# Patient Record
Sex: Female | Born: 1935 | Race: White | Hispanic: No | Smoking: Never smoker
Health system: Southern US, Community
[De-identification: ages and names within clinical notes are randomized; demographics above are authoritative.]

## PROBLEM LIST (undated history)

## (undated) DIAGNOSIS — E785 Hyperlipidemia, unspecified: Secondary | ICD-10-CM

## (undated) DIAGNOSIS — M8580 Other specified disorders of bone density and structure, unspecified site: Secondary | ICD-10-CM

## (undated) DIAGNOSIS — M1990 Unspecified osteoarthritis, unspecified site: Secondary | ICD-10-CM

## (undated) DIAGNOSIS — I10 Essential (primary) hypertension: Secondary | ICD-10-CM

## (undated) DIAGNOSIS — E559 Vitamin D deficiency, unspecified: Secondary | ICD-10-CM

## (undated) HISTORY — PX: SHOULDER SURGERY: SHX246

---

## 1999-07-06 ENCOUNTER — Encounter: Admission: RE | Admit: 1999-07-06 | Discharge: 1999-10-04 | Payer: Self-pay | Admitting: *Deleted

## 2001-09-20 ENCOUNTER — Encounter: Admission: RE | Admit: 2001-09-20 | Discharge: 2001-09-20 | Payer: Self-pay | Admitting: *Deleted

## 2001-09-20 ENCOUNTER — Encounter: Payer: Self-pay | Admitting: *Deleted

## 2002-11-24 ENCOUNTER — Encounter: Payer: Self-pay | Admitting: *Deleted

## 2002-11-24 ENCOUNTER — Ambulatory Visit (HOSPITAL_COMMUNITY): Admission: RE | Admit: 2002-11-24 | Discharge: 2002-11-24 | Payer: Self-pay

## 2003-11-24 ENCOUNTER — Ambulatory Visit (HOSPITAL_COMMUNITY): Admission: RE | Admit: 2003-11-24 | Discharge: 2003-11-24 | Payer: Self-pay | Admitting: Gastroenterology

## 2006-01-05 ENCOUNTER — Encounter: Admission: RE | Admit: 2006-01-05 | Discharge: 2006-01-05 | Payer: Self-pay | Admitting: *Deleted

## 2008-08-24 ENCOUNTER — Inpatient Hospital Stay (HOSPITAL_COMMUNITY): Admission: EM | Admit: 2008-08-24 | Discharge: 2008-09-02 | Payer: Self-pay | Admitting: Emergency Medicine

## 2008-09-04 ENCOUNTER — Ambulatory Visit: Payer: Self-pay | Admitting: Family Medicine

## 2008-10-01 ENCOUNTER — Emergency Department (HOSPITAL_BASED_OUTPATIENT_CLINIC_OR_DEPARTMENT_OTHER): Admission: EM | Admit: 2008-10-01 | Discharge: 2008-10-01 | Payer: Self-pay | Admitting: Emergency Medicine

## 2008-10-06 ENCOUNTER — Observation Stay (HOSPITAL_COMMUNITY): Admission: EM | Admit: 2008-10-06 | Discharge: 2008-10-08 | Payer: Self-pay | Admitting: Emergency Medicine

## 2009-02-22 ENCOUNTER — Inpatient Hospital Stay (HOSPITAL_COMMUNITY): Admission: AD | Admit: 2009-02-22 | Discharge: 2009-02-24 | Payer: Self-pay | Admitting: Orthopedic Surgery

## 2009-02-25 ENCOUNTER — Encounter: Payer: Self-pay | Admitting: Family Medicine

## 2009-02-25 ENCOUNTER — Ambulatory Visit: Payer: Self-pay | Admitting: Family Medicine

## 2009-02-25 DIAGNOSIS — M84439A Pathological fracture, unspecified ulna and radius, initial encounter for fracture: Secondary | ICD-10-CM | POA: Insufficient documentation

## 2009-02-25 DIAGNOSIS — E785 Hyperlipidemia, unspecified: Secondary | ICD-10-CM | POA: Insufficient documentation

## 2009-02-25 DIAGNOSIS — I10 Essential (primary) hypertension: Secondary | ICD-10-CM | POA: Insufficient documentation

## 2009-02-25 DIAGNOSIS — L299 Pruritus, unspecified: Secondary | ICD-10-CM | POA: Insufficient documentation

## 2009-02-25 DIAGNOSIS — M810 Age-related osteoporosis without current pathological fracture: Secondary | ICD-10-CM | POA: Insufficient documentation

## 2009-02-25 DIAGNOSIS — M7500 Adhesive capsulitis of unspecified shoulder: Secondary | ICD-10-CM | POA: Insufficient documentation

## 2009-02-25 DIAGNOSIS — R269 Unspecified abnormalities of gait and mobility: Secondary | ICD-10-CM | POA: Insufficient documentation

## 2009-02-25 DIAGNOSIS — S82899A Other fracture of unspecified lower leg, initial encounter for closed fracture: Secondary | ICD-10-CM | POA: Insufficient documentation

## 2009-06-09 ENCOUNTER — Emergency Department (HOSPITAL_COMMUNITY): Admission: EM | Admit: 2009-06-09 | Discharge: 2009-06-09 | Payer: Self-pay | Admitting: Emergency Medicine

## 2010-10-20 ENCOUNTER — Encounter
Admission: RE | Admit: 2010-10-20 | Discharge: 2010-10-20 | Payer: Self-pay | Source: Home / Self Care | Attending: Family Medicine | Admitting: Family Medicine

## 2011-01-31 LAB — CBC
HCT: 33.8 % — ABNORMAL LOW (ref 36.0–46.0)
MCHC: 34.6 g/dL (ref 30.0–36.0)
MCV: 92.4 fL (ref 78.0–100.0)
RBC: 3.65 MIL/uL — ABNORMAL LOW (ref 3.87–5.11)
RDW: 13.6 % (ref 11.5–15.5)

## 2011-01-31 LAB — COMPREHENSIVE METABOLIC PANEL
ALT: 14 U/L (ref 0–35)
Albumin: 3.7 g/dL (ref 3.5–5.2)
Alkaline Phosphatase: 59 U/L (ref 39–117)
BUN: 12 mg/dL (ref 6–23)
Calcium: 9.2 mg/dL (ref 8.4–10.5)
Creatinine, Ser: 0.98 mg/dL (ref 0.4–1.2)
GFR calc Af Amer: >60 mL/min (ref >60)
Potassium: 3.7 mEq/L (ref 3.5–5.1)
Sodium: 134 mEq/L — ABNORMAL LOW (ref 135–145)
Total Bilirubin: 0.9 mg/dL (ref 0.3–1.2)

## 2011-01-31 LAB — PROTIME-INR: INR: 1.1 (ref 0.00–1.49)

## 2011-01-31 LAB — APTT: aPTT: 34 seconds (ref 24–37)

## 2011-03-07 NOTE — H&P (Signed)
NAME:  [name], [name]               ACCOUNT NO.:  [id number]   MEDICAL RECORD NO.:  [id number]          PATIENT TYPE:  OBV   LOCATION:  0104                         FACILITY:  [facility]   PHYSICIAN:  Hollice Espy, M.D.DATE OF BIRTH:  1922-04-18   DATE OF ADMISSION:  10/06/2008  DATE OF DISCHARGE:                              HISTORY & PHYSICAL   PRIMARY CARE PHYSICIAN:  Dr. Camillo Flaming.   CHIEF COMPLAINT:  Falls.   HISTORY OF PRESENT ILLNESS:  The patient is an 77 year old white female  with past medical history of hypertension, who had normal renal function  1 month prior when she sustained a fall injury leading to a fracture of  her right humerus.  At that time, she had a surgical repair and was  discharged home.  She reports in the past few weeks she said problems  with increasing weakness, lightheadedness, dizziness and more falls.  She was seen in the emergency room on October 01, 2008.  She was sent  over from the office where it was noted that her BUN and creatinine were  up with a BUN of 30 and a creatinine of 1.8.  The patient received IV  fluids and was discharged back home.  Since that time, she had continued  problems with lightheadedness and then today she returned after  sustaining another fall, landing on her right hand, the same arm that  she had her surgery on.  X-rays were done which showed no evidence of  any misalignment or repeat fracture.  Labs were checked on the patient.  She was found to have a slightly low potassium of 126, potassium of 2.7,  a normal white count with no shift, normal urinalysis and a BUN 15 and  creatinine of 1.47, again with a previous normal creatinine a month ago  of 0.8.  CT scan head was done which was unremarkable.  With these  findings, it was felt best that patient come in for further evaluation  and treatment.  Currently, she is doing okay.  She complains of some  mild dizziness and weakness, as well as some right arm pain.  She  denies  any headaches, vision changes, dysphagia, chest pain, palpitations,  shortness of breath, wheezing or coughing.  No abdominal pain, no  hematuria or dysuria.  No constipation or diarrhea.  She complains of  some right upper extremity pain where she had fallen and landed on her  arm, but review of systems is otherwise negative.   PAST MEDICAL HISTORY:  1. Hypertension.  2. Recent right humeral fracture, status post repair.  3. Hyperlipidemia.   MEDICATIONS:  She is on atenolol, Lipitor, Dynacin and Micardis.   ALLERGIES:  NO KNOWN DRUG ALLERGIES.   SOCIAL HISTORY:  No tobacco, alcohol or drug use.   FAMILY HISTORY:  Noncontributory.   PHYSICAL EXAMINATION:  VITAL SIGNS:  On admission, temperature 97.9,  heart rate 63, blood pressure 168/74, respirations 20.  O2 sat 99% on  room air.  GENERAL:  She is alert and oriented x3 in no apparent distress.  HEENT:  Normocephalic, atraumatic.  Mucous  membranes are slightly dry.  NECK:  She has no carotid bruits.  HEART:  Regular rate and rhythm.  S1-S2.  LUNGS:  Clear to auscultation bilaterally.  ABDOMEN:  Soft, nontender, nondistended.  Positive bowel sounds.  EXTREMITIES:  Show no clubbing, cyanosis or edema.  I deferred  musculoskeletal exam of her right upper extremity secondary to pain,  otherwise unremarkable.   X-RAYS:  CT scan of the head shows mild age-related atrophy, chronic  small vessel disease, no acute process.  Right humerus x-ray shows  evidence of healing of fracture of mid humerus at tibial prosthesis.  There is at least loosening and possibly fracture of the proximal  humerus seen previously.  This was discussed with the patient's  orthopedist, Dr. Melvyn Novas and is not felt to be anything acute or  reversible.   LABORATORY DATA:  White count 10.4, hemoglobin and hematocrit 13 and 39,  MCV 94, platelet count 182, no shift.  Sodium 126, potassium 3.7,  chloride 86, bicarb 28, BUN 15, creatinine 1.47, glucose  106.  Cardiac  markers unremarkable.  UA completely normal.   ASSESSMENT/PLAN:  1. Acute renal failure.  2. Hyponatremia.  3. Hypokalemia.  4. Falls with dizziness.   This all equates mostly to the patient being dehydrated, probably  potentiated by continuing to take her antihypertensive medications.  Will hydrate.  Have PT/OT to see.  Keep for 24-hour observation and plan  to discharge tomorrow.      Hollice Espy, M.D.  Electronically Signed     SKK/MEDQ  D:  10/06/2008  T:  10/06/2008  Job:  [id number]   cc:   Juluis Rainier, M.D.  Fax: [phone]

## 2011-03-07 NOTE — H&P (Signed)
[name], [name]               ACCOUNT NO.:  [id number]   MEDICAL RECORD NO.:  [id number]          PATIENT TYPE:  INP   LOCATION:  1614                         FACILITY:  [facility]   PHYSICIAN:  Marlowe Kays, M.D.  DATE OF BIRTH:  [birth year]   DATE OF ADMISSION:  02/22/2009  DATE OF DISCHARGE:                              HISTORY & PHYSICAL   CHIEF COMPLAINT:  Pain in my left ankle.   PRESENT ILLNESS:  This 77 year old white female was at her [place]  party near [place], [place] when she was descending some stairs  and slipped twisting her left ankle.  She had immediate pain to the  ankle and was unable to stand it was taken to [facility]  where x-rays were performed and closed reduction was performed by the  emergency room physician after consultation with an orthopedist. And she  was put in a posterior splint/cast.  She was offered to be treated  there, but her home as in [place].  She chose to come back to  [facility] for treatment.  She saw Dr. Melvyn Novas today in our office due  to the fact that he had previously treated a left wrist fracture.  Her  ankle continues to be displaced at the fracture site.  We were on call  for the practice, and Dr. Melvyn Novas referred the patient to [place].  We plan  to return the patient to the surgical suite at which time we will remove  all the cast material and attempt closed reduction for better alignment  and recast her.  Surgery is not indicated at this time due to the fact  that she has a considerable amount of fracture blisters and this would  of course allow the possibility of entry of infection into the left  ankle joint.   When seen in the patient's room, her son, Onalee Hua, was present.  Dr.  Simonne Come outlined our plans.   PAST MEDICAL HISTORY:  This lady has been in relatively good health  throughout her lifetime.  She has developed hyperlipidemia,  hypertension.   CURRENT MEDICATIONS:  Lipitor 80 mg 1/2 pill daily,  atenolol 100 mg  daily, hydrocodone p.r.n., naproxen 500 mg q.12 h. p.r.n., Caltrate  daily, and Benadryl 1 or 2 as needed.   ALLERGIES:  She has no known medical allergies.   REVIEW OF SYSTEMS:  CNS: No seizures or paralysis, numbness, double  vision.  RESPIRATORY:  No productive cough, no hemoptysis or shortness  of breath.  Cardiovascular: No chest pain, no angina, no orthopnea.  GASTROINTESTINAL: No nausea, vomiting, melena, or bloody stool.  GENITOURINARY:  No discharge, dysuria, or hematuria.  MUSCULOSKELETAL:  Primarily in present illness.   SOCIAL HISTORY:  The patient neither smokes nor drink.   PHYSICAL EXAMINATION:  GENERAL APPEARANCE:  Alert and cooperative 7-  year-old white female seen lying in the hospital bed.  Her son is  present.  VITAL SIGNS:  Blood pressure 184/75, pulse 75 regular, respirations 16,  temperature 97.7.  HEENT: Normocephalic. PERRLA.  No trauma.  NECK:  Supple. No lymphadenopathy.  CHEST:  Clear to auscultation. No  rhonchi or rales.  HEART: Regular rate and rhythm.  No murmurs are heard.  ABDOMEN: Soft, nontender.  Bowel sounds present.  GENITALIA, RECTAL, PELVIC, BREASTS:  Not done. Not pertinent to present  illness.  EXTREMITIES:  Left lower extremity is in a short-leg cast/splint.  She  has less sensation to the toes, but she can move the toes well.  She has  good capillary refill of the toe tips.   ADMISSION DIAGNOSES:  1. Closed displaced fracture of the left ankle (tibia and fibula).  2. Hypertension.  3. Hyperlipidemia.   PLAN:  We will be taking the patient for closed reduction and casting of  the left ankle using C-arm for visualization.  At a later date,  depending on how well the fracture can be reduced, may go for open  reduction and internal fixation of the ankle fracture.  Again all the  above was explained to the patient and her son by Dr. Simonne Come.      Dooley L. Cherlynn June.    ______________________________   Marlowe Kays, M.D.    DLU/MEDQ  D:  02/22/2009  T:  02/22/2009  Job:  [id number]   cc:   Marlowe Kays, M.D.  Fax: [phone]

## 2011-03-07 NOTE — Discharge Summary (Signed)
NAME:  [name], [name] NO.:  [id number]   MEDICAL RECORD NO.:  [id number]          PATIENT TYPE:  OBV   LOCATION:  1431                         FACILITY:  [facility]   PHYSICIAN:  Ramiro Harvest, MD    DATE OF BIRTH:  [birth year]   DATE OF ADMISSION:  10/06/2008  DATE OF DISCHARGE:                               DISCHARGE SUMMARY   The patient's primary care physician is Dr. Juluis Rainier of [facility].   DISCHARGE DIAGNOSES:  1. Acute renal failure.  2. Hyponatremia.  3. Hypokalemia.  4. Falls.  5. Dehydration.  6. Hypertension.  7. Recent right humeral fracture status post repair.  8. Hyperlipidemia.  9. Osteoporosis.  10.Right periprostatic humerus fracture.   DISCHARGE MEDICATIONS:  1. Lipitor 40 mg p.o. daily.  2. Aspirin 81 mg p.o. daily.  3. Vitamin D plus Caltrate p.o. daily.   The patient has been instructed to not take her Micardis and to not take  her atenolol until she follows up with her PCP at which point in time it  will be decided whether to restart the patient's antihypertensive  medications.   DISPOSITION AND FOLLOW-UP:  The patient will be discharged home with  home health, PT, OT.  The patient is to follow up with PCP in 1 week. On  follow-up the patient will need a basic metabolic profile to follow-up  the patient's renal function and electrolytes, and the patient's blood  pressure will need to be reassessed to determine whether the patient's  antihypertensives need to be restarted.   CONSULTATIONS DONE:  None.   PROCEDURES DONE:  1. Plain x-rays of the right humerus were done on October 06, 2008      that showed evidence of healing of the fracture of the mid humerus      at the tip of the prosthesis. There is at least loosening and      possible fracture of the proximal humerus as seen previously as      well.  2. CT of the head obtained October 06, 2008 showed mild age-related      atrophy, chronic small-vessel  disease.  No sign of acute or      reversible process.   BRIEF ADMISSION HISTORY AND PHYSICAL:  [name] is an 77-year-  old pleasant female with a past medical history of hypertension who had  normal renal function 1 month prior to admission when she sustained a  fall injury leading to a fracture of her right humerus.  At that time  she had a surgical repair, was discharged home. She reports that in the  past few months that she has had problems with increased weakness,  lightheadedness, dizziness and more falls.  She was seen in the ED on  October 01, 2008. She was sent over from the office where it was noted  that her BUN and creatinine were up with a BUN of 30 and a creatinine of  1.8.  The patient received IV fluids, was discharged back home.  Since  that time she continued with problems with lightheadedness, and then  on  the day of admission returned after sustaining another fall, landing on  her right hand, the same arm where she had her surgery. X-rays were done  which showed no evidence of any misalignment or repeat fracture.  Labs  were checked on the patient.  The patient was found to have a sodium of  126 and a potassium of 2.7, normal white count, no shift. Normal  urinalysis, BUN of 15, creatinine of 1.47. Again with a previous normal  creatinine a month ago of 0.8.  CT scan of the head was done which was  unremarkable. With these findings it was felt the patient could come in  for further evaluation and treatment.  Currently the patient is doing  fine.  The patient complained of some mild dizziness and weakness as  well as some right arm pain.  She denied any headaches, no visual  changes, dysphagia, no chest pain or palpitations.  No shortness of  breath, no wheezing or coughing.  No abdominal pain, no hematuria or  dysuria, no constipation or diarrhea.  The patient complained of some  right upper extremity pain where she had fallen and landed on her arm.  But  review of systems was otherwise negative.   PHYSICAL EXAM:  VITAL SIGNS:  Per admitting physician temperature 97.9,  pulse of 63, blood pressure 168/74, respiratory rate 20, satting 99% on  room air.  GENERAL: The patient is alert and oriented x3.  No apparent distress.  HEENT: Normocephalic, atraumatic.  Mucous membranes are slightly dry.  NECK:  With no carotid bruits.  CARDIOVASCULAR: Regular rate and rhythm, S1 and S2.  RESPIRATORY:  Lungs are clear to auscultation bilaterally.  ABDOMEN: Soft, nontender, nondistended, positive bowel sounds.  EXTREMITIES: No clubbing, cyanosis or edema.  MUSCULOSKELETAL:  Exam was deferred due to pain in the right upper  extremity. Otherwise unremarkable.   X-RAYS:  CT of the head showed mild age-related atrophy, chronic small-  vessel disease.  No acute process. Right humerus x-ray showed evidence  of fever, no fracture of the mid humerus. At the tibial prosthesis there  is at least loosening and possibly fracture of the proximal humerus seen  previously.  This was discussed with the patient's orthopedist, Dr.  Melvyn Novas, and it was not felt to be anything acute or reversible.   CBC white count 10.4, hemoglobin 13, hematocrit 39, MCV 94, platelets  182, no shift.  Sodium 126, potassium 3.7, chloride 86, bicarb 28, BUN  15, creatinine 1.47, glucose of 106.  Cardiac markers were unremarkable.  UA was completely normal.   HOSPITAL COURSE:  1. Acute renal failure.  The patient was admitted with acute renal      failure.  It was felt the patient's acute renal failure was      secondary to prerenal azotemia, secondary to dehydration in the      setting of antihypertensive use.  Urine sodium and urine creatinine      were obtained.  The patient was placed on IV fluids and hydrated.      The patient's renal function was monitored and by day of discharge      the patient's renal function was back to her baseline and the      patient's acute renal failure  had resolved.  The patient's      creatinine on discharge was 0.85, and the patient will be      discharged in stable and improved condition.  2. Hyponatremia.  The patient  on admission was noted to hyponatremic.      It was felt this was secondary to hypovolemic hyponatremia in the      setting of her diuretic use. A TSH was checked which came back at      5.69, and a free T4 was obtained which was within normal limits. A      free T3 was also obtained which was within normal limits.  Head CT      was obtained with results as stated above.  The patient was      hydrated with IV fluids and the patient's sodium levels were      monitored.  The patient's hyponatremia improved on a daily basis      and by day of discharge the patient's sodium was at 132. The      patient will need follow-up BMET with PCP in 1 week for follow-up      on the patient's hyponatremia.  3. Hypokalemia.  The patient was noted to be hypokalemic. During the      admission the patient's potassium was repleted, and by day of      discharge the patient's hypokalemia had resolved.  4. Dehydration.  The patient was hydrated with IV fluids and monitored      throughout the hospitalization. By day of discharge the patient was      euvolemic. The rest of the patient's chronic medical issues were      stable throughout the hospitalization, and the patient was      discharged in stable and improved condition with follow-up as      stated above.   On day of discharge vital signs temperature 98.2, blood pressure 130/63,  pulse of 67, respiratory rate 18, satting 96% on room air.   DISCHARGE LABS:  Sodium 132, potassium 4.2, chloride 102, bicarb 24, BUN  10, creatinine 0.85, glucose of 86 and a calcium of 8.9, magnesium level  of 1.8.  It was a pleasure taking care of [name].      Ramiro Harvest, MD  Electronically Signed     DT/MEDQ  D:  10/08/2008  T:  10/08/2008  Job:  [id number]   cc:   Dossie Der, MD  Fax: [phone]

## 2011-03-07 NOTE — Op Note (Signed)
NAME:  [name], [name] NO.:  [id number]   MEDICAL RECORD NO.:  [id number]          PATIENT TYPE:  INP   LOCATION:  5024                         FACILITY:  [facility]   PHYSICIAN:  Madelynn Done, MD  DATE OF BIRTH:  [birth year]   DATE OF PROCEDURE:  08/26/2008  DATE OF DISCHARGE:                               OPERATIVE REPORT   PREOPERATIVE DIAGNOSES:  1. Right wrist intra-articular distal radius fracture 4 more fragments      associated ulnar styloid fracture.  2. Osteoporosis.   POSTOPERATIVE DIAGNOSES:  1. Right wrist intra-articular distal radius fracture 4 more fragments      associated ulnar styloid fracture.  2. Osteoporosis.   ATTENDING SURGEON:  Madelynn Done, MD, who scrubbed and present for  the entire procedure.   ASSISTANT SURGEON:  None.   SURGICAL PROCEDURES:  1. Open treatment of right intra-articular distal radius fracture 4      more fragments with internal fixation.  2. Extensor pollicis longus tendo sheath incision, then released.  3. Radiograph reviews, right forearm and wrist.   ANESTHESIA:  General via endotracheal tube.   SURGICAL IMPLANT:  Synthes dorsal bridge plate with 3 screws distally  and four screws proximally, 2 bicortical screws and the rest locking  screws.   SURGICAL INDICATIONS:  [name] is an 77 year old female who fell at  home and sustained a complex and significantly comminuted distal radius  fracture.  The patient had marked displacement of her radius Grimes after  reduction.  The above procedure was recommended in order to restore the  length to the radius as well as provide a mechanism for appropriate  healing.  Risks, benefits, and alternatives were discussed in detail  with the patient and the patient's son.  Risks include but not limited  to bleeding, infection, nerve damage, nonunion, malunion, hardware  failure, damage to nearby nerves, arteries, and tendon and need for  further surgical  intervention.  All questions were addressed prior to  surgery.   Bone graft substitute 5 mL of Ortho-Glass mixed with cancellous bone  chip.   DESCRIPTION OF PROCEDURE:  The patient was properly identified in the  preoperative holding area and a marker made on the right wrist to  indicate correct operative site.  The patient then brought back to the  operating room, placed supine on the anesthesia table, general  anesthesia was administered via endotracheal tube.  The patient received  preoperative antibiotics prior to any skin incisions.  A well-padded  tourniquet was then placed on the right brachium and sealed with a 1000  drape.  The right upper extremity was prepped and draped in normal  sterile fashion.  A time-out was called.  The correct site was  identified, and the procedure was then begun.  Closed manipulation was  then performed in its position confirmed using a mini C-arm.  It was  felt to be an adequate reduction via closed methods, however, after the  reduction, has a high degree of ostia, poor bone quality as there was  further collapse and it was felt necessary  to proceed with operative  intervention.  Using the finger trap traction and 10 pounds weight,  finger traps were applied and the reduction was then performed and  maintained of the length.  The limb was then elevated using Esmarch  exsanguination.  The tourniquet was insufflated.  Using 3 incisions one  directly over the long finger metacarpal, the other one directly over  Lister tubercle, and one more proximally.  Dissection was carried down  through the skin and subcutaneous tissue.  The EPL was then identified  directly over the Lister tubercle and tendon sheath was incised and the  EPL was then transferred radially.  Going through the portion between  the third and fourth dorsal compartment, the large metaphysial void in  the bone was then identified.  A 5 mL of Ortho-Glass bone graft  substitute was then  packed into the defect.  There was still defect  present and then using a several mL of the cancellous chip, this was  packed dorsally from a dorsal volar direction to get some support and  sealed the large metaphysial void.  After the open bone grafting, the  open reduction was then performed of the 4 more intra-articular  fragments.  This was done and then the plate was then placed from the  distal to proximal direction.  After placement of the plate, the  alignment was then confirmed and using the mini C-arm.  It was then  fixed distally with a 2.4 mm of bicortical screw and it was then fixed  proximally with another 2.7 mm bicortical screw.  Its position was then  confirmed, then the reduction was maintained.  Following these remaining  screws, 2 more distally and 3 more proximally bicortical locking screws  were then placed with good fixation.  There was screw holes present  directly over the fracture site but secondary to the high degree of  comminution and osteal growth, screws were then placed in the central  portion of the plate.  After the open reduction was then complete, final  radiographs were then obtained in both planes, the wrist and forearm.  The wounds were then thoroughly irrigated with saline solution.  The  deep tissues directly over the Lister tubercle were then closed with a 2-  0 Vicryl and closed over the plate.  There was a barrier between the  endplate.  The wound was then irrigated and then subcutaneous stitch  with 4-0 Vicryl suture.  The tourniquet was then deflated with good  perfusion of the digits.  Tourniquet was then deflated.  Using 3-0 and 4-  0 nylon sutures, the skin was then closed in horizontal, mattress, and  running sutures.  Adaptic, Xeroform dressing, sterile compressive  dressing was then applied.  The patient was then extubated and taken to  recovery room in good condition.  The patient final images were obtained  to reduce the wrist,  forearm.   Postoperative intraoperative radiograph reviews; the wrist do show good  position of the implant.  She does have a comminuted fragment.  There  was still displacement of the fracture site with improvement in radius  alignment and the radius is backed out of the way.   POSTOPERATIVE PLAN:  The patient would be admitted for IV antibiotics  for pain control, to be seen at the 10-day mark and 4-week mark,  radiographs at each time, plate likewise, for a total of 10 weeks.      Madelynn Done, MD  Electronically Signed  FWO/MEDQ  D:  08/26/2008  T:  08/27/2008  Job:  [id number]

## 2011-03-07 NOTE — Op Note (Signed)
[name], [name]               ACCOUNT NO.:  [id number]   MEDICAL RECORD NO.:  [id number]          PATIENT TYPE:  INP   LOCATION:  1614                         FACILITY:  [facility]   PHYSICIAN:  Marlowe Kays, M.D.  DATE OF BIRTH:  1921/11/16   DATE OF PROCEDURE:  02/22/2009  DATE OF DISCHARGE:                               OPERATIVE REPORT   PREOPERATIVE DIAGNOSIS:  Closed bimalleolar fracture dislocation, left  ankle.   POSTOPERATIVE DIAGNOSIS:  Closed bimalleolar fracture dislocation, left  ankle.   OPERATION:  Closed reduction of the bimalleolar fracture dislocation  left ankle with lysis of fracture blister and application of a PTB  fiberglass cast.   SURGEON:  Marlowe Kays, M.D.   ASSISTANTTerie Purser L. Cherlynn June.   ANESTHESIA:  General.   DESCRIPTION OF PROCEDURE:  She has had a prior distal fibular fracture  and had a plate on it.  She injured this ankle down in [place] 2 days  ago, was seen locally there at [facility] and was given the  choice of having the ankle fixed there or to come home and she elected  to come on home.  She was seen in our acute care clinic this afternoon  and was found to have a posterior dislocation of the talus relative to  the ankle and displaced fractures of the posterior malleolus and the  medial malleolus Accordingly, she is admitted at this time for closed  reduction of the ankle dislocation with thought that any operative  intervention would be postponed until her fracture blisters had healed.   PROCEDURE:  Under satisfactory general anesthesia, the short-leg splint  cast was removed and using the C-arm, we took baseline AP and lateral x-  rays of her ankle.  With Mr. Idolina Primer using counter traction on the  proximal tibia and me at the ankle, with posterior to anterior tugging  and traction, I was able to reduce the dislocation but we found it to be  quite unstable.  With manipulation I was able to obtain close  to an  anatomic reduction of the 2 fractures as well as reduction of the  dislocation.  I then placed Betadine over the fracture blister, lysed it  with a sterile needle and then we applied Adaptic and Betadine solution  over the Adaptic, followed by sterile 4x4s and while I held the ankle  reduced, Mr. Idolina Primer applied a PTB cast with traction, dorsal stress  on the ankle and slight inversion to try and reduce the medial malleolar  fracture.  Once the cast had been applied, we took AP and lateral x-rays  and it showed anatomic reduction of not only the ankle dislocation but  the 2 fractures.  Accordingly we then awakened her and she was taken to  the recovery in satisfactory tissue with no known complication.           ______________________________  Marlowe Kays, M.D.     JA/MEDQ  D:  02/22/2009  T:  02/23/2009  Job:  [id number]

## 2011-03-07 NOTE — Discharge Summary (Signed)
[name], [name]               ACCOUNT NO.:  [id number]   MEDICAL RECORD NO.:  [id number]          PATIENT TYPE:  INP   LOCATION:  1614                         FACILITY:  [facility]   PHYSICIAN:  Marlowe Kays, M.D.  DATE OF BIRTH:  [birth year]   DATE OF ADMISSION:  02/22/2009  DATE OF DISCHARGE:                               DISCHARGE SUMMARY   DATE OF ANTICIPATED DISCHARGE:  Feb 24, 2009.   ADMITTING DIAGNOSES:  1. Closed displaced fracture of the left ankle (tibia and fibula and      talus).  2. Hypertension.  3. Hyperlipidemia.   DISCHARGE DIAGNOSES:  1. Closed displaced fracture of the left ankle (tibia and fibula and      talus).  2. Hypertension.  3. Hyperlipidemia.  4. Periprosthetic fracture of the right humerus and fracture right      wrist in the distant past.   OPERATION:  On Feb 22, 2009 the patient underwent closed bimalleolar  fracture dislocation left ankle.  D. Limmie Patricia.A. assisted.   BRIEF HISTORY:  This 77 year old lady who was at her birthday [place] in  [name].  She was descending some stairs and twisted her left  ankle/foot.  She had immediate pain in the ankle, foot area with  swelling.  She was taken to [facility].  The x-ray showed a  bimalleolar fracture dislocation.  Closed reduction was performed, and  she was placed in a posterior splint cast.  She chose to return to her  home in [name] for further treatment.  Dr. Melvyn Novas saw her today due  to the fact that he had previously treated her for a right  periprosthetic humeral fracture and wrist fracture.  He attempted closed  reduction in the office.  However, due to the spasms and swelling in the  leg as well as fracture blisters, this was not accomplished.  We decided  to admit her for a closed reduction under anesthesia and cast after  application.   COURSE IN THE [place]:  The patient tolerated surgical procedure quite  well.  The reduction was very satisfactory.  She  tolerated the cast.  Was able to get about the bedside chair.  Nonweightbearing was allowed;  nonweightbearing on the left lower extremity and minimal on the right  upper extremity as tolerated.  We thought that maybe a platform walker  might be indicated.  We will try this prior to her discharge to  [name] where we believe the bed has been chosen for her  rehabilitation.   Neurovascular is intact to the toes, good capillary refills on the left.  The morning of anticipated discharge she was bright, cheerful.  Had  minimal to no discomfort.   We placed her on Lovenox postoperatively for the prevention of DVT, and  we will discontinue this and give her aspirin daily when she is  discharged.   LABORATORY VALUES IN THE [place]:  Showed a hematocrit of 33.8,  hemoglobin is 11.7.  Blood chemistry was essentially normal.  Sodium was  134.  Vital signs were stable, and the INR on Feb 22, 2009 was  1.1.  No  chest x-ray seen on this chart.  The electrocardiogram showed normal  sinus rhythm.   CONDITION ON DISCHARGE:  Improved, stable.   PLAN:  The patient is discharged to skilled nursing for continuing  rehabilitation.  Orthopedically the cast to be kept clean and dry.  We  recommend no weightbearing on the left lower extremities so as not to  displace the closed reduction.  She may rest the cast on the ground, but  no weightbearing.  If she needs a platform walker for ambulation for the  right upper extremity,  recommend she continue with that for her  ambulation.  We would like to follow her back in our office in 1 week at  which time we will x-ray her left ankle/foot in the cast.   DISCHARGE MEDICATIONS:  1. Lipitor 80 mg 1/2 pill daily.  2. Atenolol 10 mg daily.  3. Naproxen 500 mg every 12 hours as needed.  4. Caltrate daily.  5. Benadryl 1-2 as needed for p.r.n.  6. Hydrocodone is primary analgesic, and will give her 1 every 4-6      hours p.r.n. pain.  Should she have  spasms in the left lower      extremity, we recommend Robaxin 500 mg p.o. q.6-8 hours p.r.n.      spasms.  7. We will DC the Lovenox, and give her enteric-coated aspirin daily.   Any problems or questions as far as orthopedic status is concerned  should be addressed to Dr. Leslee Home at [phone].  Any other  medications, questions should be to her family physician or the staff  physician at the skilled nursing facility.      Dooley L. Cherlynn June.    ______________________________  Marlowe Kays, M.D.    DLU/MEDQ  D:  02/24/2009  T:  02/24/2009  Job:  [id number]

## 2011-03-07 NOTE — Discharge Summary (Signed)
[name], MANZER NO.:  [id number]   MEDICAL RECORD NO.:  [id number]          PATIENT TYPE:  INP   LOCATION:  5024                         FACILITY:  [facility]   PHYSICIAN:  Madelynn Done, MD  DATE OF BIRTH:  26-Nov-1921   DATE OF ADMISSION:  08/24/2008  DATE OF DISCHARGE:  09/02/2008                               DISCHARGE SUMMARY   ADMISSION DIAGNOSES:  1. Right intraarticular distal radius fracture.  2. Osteoporosis.  3. Right periprosthetic humerus fracture.  4. Hypertension.   DISCHARGE DIAGNOSES:  1. Right intraarticular distal radius fracture.  2. Osteoporosis.  3. Right periprosthetic humerus fracture.  4. Hypertension.  5. Hypokalemia.  6. Hyponatremia.   PROCEDURES AND DATES:  Open treatment of comminuted right wrist  intraarticular distal radius fracture on August 26, 2008.   DISCHARGE MEDICATIONS:  1. Vicodin 5/500, 1-2 tablets p.o. q.4-6 h. as needed for pain.  2. Colace 100 mg p.o. b.i.d.  3. Vitamin C 500 mg p.o. b.i.d.  4. Crestor 20 mg p.o. daily.  5. Benicar 5 mg p.o. daily.  6. Atenolol 50 mg p.o. daily.   REASON FOR ADMISSION:  Ms. Larock is an 77 year old female who fell at  home sustaining close injuries to her right wrist and right humerus.  The patient was admitted for pain control and to undergo the above  procedure.   HOSPITAL COURSE:  The patient was admitted to the orthopedic floor.  Preoperative imaging test did confirm the comminuted and markedly  displaced fracture of the distal radius.  It was felt amenable to  internal-ex fix construct.  The patient continued on the floor on  postoperative antibiotics after she tolerated the procedure well under  general anesthesia.  The patient was also noted to have a periprosthetic  humerus fracture with loose hemiarthroplasty stem.  They were going to  continue with the closed treatment of this injury.  The patient was  noted to be having a bit of nausea and vomiting.   Postoperatively, her  serum electrolytes were checked, and she was noted to be a bit  hyponatremic that responded to fluid management.  Her hypokalemia also  was noted and she responded to supplementation with KCl.  Internal  Medicine continued to follow the patient throughout.  She appeared to  respond to the treatment modalities.  The patient was seen and examined  on September 02, 2008, and felt ready to be discharged to a nursing care  facility.   RECOMMENDATIONS AND DISPOSITION:  The patient to be discharged to a  nursing care facility.  She is nonweightbearing in the right upper  extremity.  She is to wear a sling at her arm when she is up out of bed.  She can keep her arm rested by her side when she is in bed.  No lifting  with that right arm.  OT is recommended to maintain strict motion of her  digits and edema control.  She can keep her hand propped up above her  heart this is going to be better.  She will need to follow up in my  office, office telephone number [phone] in 1 week.  She is to keep her  current dressing and splint on at all times to her right wrist.  If she  has any fevers, chills, nausea, vomiting, worsening pain, or problems  with the arm, please contact my office.  The patient may require further  inpatient admission and surgery on her right upper extremity if there  does not appear to be evidence of healing of the periprosthetic humerus  fracture.  The surgery would require revision arthroplasty with likely  allograft potential and cables.  Prior to discharge, the patient's  discharge instructions were explained to her, questions were answered,  and the patient voiced understanding   CONDITION AT DISCHARGE:  Good.   CONSULTING PHYSICIAN:  [facility].      Madelynn Done, MD  Electronically Signed     FWO/MEDQ  D:  09/02/2008  T:  09/02/2008  Job:  [id number]

## 2011-03-10 NOTE — Op Note (Signed)
NAME:  [name], [name]                         ACCOUNT NO.:  [id number]   MEDICAL RECORD NO.:  [id number]                   PATIENT TYPE:  AMB   LOCATION:  ENDO                                 FACILITY:  [facility]   PHYSICIAN:  James L. Malon Kindle., M.D.          DATE OF BIRTH:  Apr 05, 1922   DATE OF PROCEDURE:  11/24/2003  DATE OF DISCHARGE:                                 OPERATIVE REPORT   PROCEDURE:  Colonoscopy.   ENDOSCOPIST:  Llana Aliment. Edwards, M.D.   MEDICATIONS:  Fentanyl 70 mcg, Versed 7 mg IV.   SCOPE:  Olympus pediatric adjustable colonoscope.   INDICATIONS FOR PROCEDURE:  Colon cancer screening.   DESCRIPTION OF PROCEDURE:  The procedure had been explained to the patient  and a consent obtained.  With the patient in the left lateral decubitus  position, the Olympus scope was inserted and advanced.  The prep was  excellent.  I was able to advance to the cecum using abdominal pressure and  position changes.  The scope was withdrawn in the cecum.  The ascending  colon, hepatic flexure, transverse colon, splenic flexure, descending and  sigmoid colon were seen well.  No polyps seen throughout.  No diverticulosis  in the sigmoid colon.  The rectum was free of polyps.  The scope was  withdrawn.  The patient tolerated the procedure well.   ASSESSMENT:  Normal screening colonoscopy - code #V765.1.   RECOMMENDATIONS:  Routine followup with yearly Hemoccults.  Consider another  examination in 10 years, if clinically appropriate.                                               James L. Malon Kindle., M.D.    Waldron Session  D:  11/24/2003  T:  11/24/2003  Job:  [id number]   cc:   Al Decant. Janey Greaser, MD  [address]  [place]  [place] [address]  Fax: [phone]

## 2011-07-26 LAB — URINALYSIS, ROUTINE W REFLEX MICROSCOPIC
Bilirubin Urine: NEGATIVE
Glucose, UA: NEGATIVE
Hgb urine dipstick: NEGATIVE
Ketones, ur: NEGATIVE
Nitrite: NEGATIVE
Protein, ur: NEGATIVE
Specific Gravity, Urine: 1.013
Urobilinogen, UA: 1
pH: 6.5

## 2011-07-26 LAB — CBC
HCT: 34.5 — ABNORMAL LOW
HCT: 37.2
Hemoglobin: 12.2
Hemoglobin: 12.4
MCHC: 34.1
MCHC: 35.3
Platelets: 179
Platelets: 211
RBC: 3.77 — ABNORMAL LOW
RBC: 4.04
RDW: 13.1
RDW: 13.4
RDW: 13.5
WBC: 11.9 — ABNORMAL HIGH
WBC: 15.8 — ABNORMAL HIGH

## 2011-07-26 LAB — BASIC METABOLIC PANEL
BUN: 11
BUN: 13
BUN: 14
BUN: 15
Calcium: 8.5
Calcium: 8.7
Calcium: 8.8
Calcium: 8.9
Calcium: 9.1
Calcium: 9.2
Creatinine, Ser: 0.74
Creatinine, Ser: 0.93
GFR calc Af Amer: >60
GFR calc Af Amer: >60
GFR calc non Af Amer: 57 — ABNORMAL LOW
GFR calc non Af Amer: >60
GFR calc non Af Amer: >60
GFR calc non Af Amer: >60
GFR calc non Af Amer: >60
Glucose, Bld: 108 — ABNORMAL HIGH
Glucose, Bld: 133 — ABNORMAL HIGH
Glucose, Bld: 96
Glucose, Bld: 98
Glucose, Bld: 99
Potassium: 4.3
Sodium: 132 — ABNORMAL LOW
Sodium: 132 — ABNORMAL LOW

## 2011-07-26 LAB — COMPREHENSIVE METABOLIC PANEL
ALT: 18
AST: 40 — ABNORMAL HIGH
Alkaline Phosphatase: 45
CO2: 28
Calcium: 8.9
Chloride: 89 — ABNORMAL LOW
Creatinine, Ser: 0.92
GFR calc Af Amer: >60
Potassium: 3.6
Sodium: 126 — ABNORMAL LOW
Total Bilirubin: 0.8
Total Protein: 6.5

## 2011-07-26 LAB — GLUCOSE, CAPILLARY: Glucose-Capillary: 177 — ABNORMAL HIGH

## 2011-07-26 LAB — CORTISOL: Cortisol, Plasma: 17.2

## 2011-07-26 LAB — IRON AND TIBC
Saturation Ratios: 20
TIBC: 210 — ABNORMAL LOW

## 2011-07-26 LAB — RETICULOCYTES
RBC.: 3.32 — ABNORMAL LOW
Retic Count, Absolute: 59.8

## 2011-07-26 LAB — HEMOGLOBIN A1C: Hgb A1c MFr Bld: 6.2 — ABNORMAL HIGH

## 2011-07-26 LAB — PROTIME-INR
INR: 1.1
Prothrombin Time: 14.1

## 2011-07-26 LAB — T3, FREE: T3, Free: 2.9 (ref 2.3–4.2)

## 2011-07-26 LAB — POTASSIUM: Potassium: 3.2 — ABNORMAL LOW

## 2011-07-26 LAB — MAGNESIUM: Magnesium: 2.1

## 2011-07-26 LAB — SODIUM, URINE, RANDOM: Sodium, Ur: 10

## 2011-07-28 LAB — DIFFERENTIAL
Basophils Absolute: 0.3 10*3/uL — ABNORMAL HIGH (ref 0.0–0.1)
Basophils Relative: 2 % — ABNORMAL HIGH (ref 0–1)
Eosinophils Absolute: 0.1 10*3/uL (ref 0.0–0.7)
Eosinophils Absolute: 0.2 10*3/uL (ref 0.0–0.7)
Eosinophils Relative: 1 % (ref 0–5)
Lymphs Abs: 1.4 10*3/uL (ref 0.7–4.0)
Monocytes Relative: 10 % (ref 3–12)
Neutro Abs: 7.5 10*3/uL (ref 1.7–7.7)
Neutrophils Relative %: 63 % (ref 43–77)
Neutrophils Relative %: 75 % (ref 43–77)

## 2011-07-28 LAB — BASIC METABOLIC PANEL
BUN: 37 mg/dL — ABNORMAL HIGH (ref 6–23)
CO2: 23 mEq/L (ref 19–32)
CO2: 24 mEq/L (ref 19–32)
CO2: 25 mEq/L (ref 19–32)
Calcium: 8.9 mg/dL (ref 8.4–10.5)
Calcium: 9 mg/dL (ref 8.4–10.5)
Calcium: 9.9 mg/dL (ref 8.4–10.5)
Creatinine, Ser: 1.8 mg/dL — ABNORMAL HIGH (ref 0.4–1.2)
GFR calc Af Amer: 55 mL/min — ABNORMAL LOW (ref >60)
GFR calc Af Amer: >60 mL/min (ref >60)
GFR calc non Af Amer: 45 mL/min — ABNORMAL LOW (ref >60)
GFR calc non Af Amer: >60 mL/min (ref >60)
Glucose, Bld: 120 mg/dL — ABNORMAL HIGH (ref 70–99)
Sodium: 126 mEq/L — ABNORMAL LOW (ref 135–145)
Sodium: 132 mEq/L — ABNORMAL LOW (ref 135–145)

## 2011-07-28 LAB — URINALYSIS, ROUTINE W REFLEX MICROSCOPIC
Bilirubin Urine: NEGATIVE
Bilirubin Urine: NEGATIVE
Hgb urine dipstick: NEGATIVE
Ketones, ur: NEGATIVE mg/dL
Nitrite: NEGATIVE
Protein, ur: NEGATIVE mg/dL
Protein, ur: NEGATIVE mg/dL
Specific Gravity, Urine: 1.009 (ref 1.005–1.030)
Urobilinogen, UA: 0.2 mg/dL (ref 0.0–1.0)
Urobilinogen, UA: 0.2 mg/dL (ref 0.0–1.0)

## 2011-07-28 LAB — COMPREHENSIVE METABOLIC PANEL
ALT: 25 U/L (ref 0–35)
AST: 40 U/L — ABNORMAL HIGH (ref 0–37)
Calcium: 9.3 mg/dL (ref 8.4–10.5)
GFR calc Af Amer: 41 mL/min — ABNORMAL LOW (ref >60)
Sodium: 126 mEq/L — ABNORMAL LOW (ref 135–145)
Total Protein: 6.8 g/dL (ref 6.0–8.3)

## 2011-07-28 LAB — CBC
MCHC: 33.5 g/dL (ref 30.0–36.0)
MCHC: 34 g/dL (ref 30.0–36.0)
Platelets: 182 10*3/uL (ref 150–400)
RDW: 13.3 % (ref 11.5–15.5)
RDW: 13.9 % (ref 11.5–15.5)

## 2011-07-28 LAB — MAGNESIUM: Magnesium: 1.8 mg/dL (ref 1.5–2.5)

## 2011-07-28 LAB — POCT CARDIAC MARKERS: Myoglobin, poc: 162 ng/mL (ref 12–200)

## 2016-11-28 ENCOUNTER — Emergency Department (HOSPITAL_COMMUNITY): Payer: Medicare Other

## 2016-11-28 ENCOUNTER — Observation Stay (HOSPITAL_COMMUNITY): Payer: Medicare Other

## 2016-11-28 ENCOUNTER — Inpatient Hospital Stay (HOSPITAL_COMMUNITY)
Admission: EM | Admit: 2016-11-28 | Discharge: 2016-11-30 | DRG: 640 | Disposition: A | Discharge status: Home / Self Care | Payer: Medicare Other | Attending: Family Medicine | Admitting: Family Medicine

## 2016-11-28 ENCOUNTER — Encounter (HOSPITAL_COMMUNITY): Payer: Self-pay | Admitting: Emergency Medicine

## 2016-11-28 DIAGNOSIS — N179 Acute kidney failure, unspecified: Secondary | ICD-10-CM | POA: Diagnosis not present

## 2016-11-28 DIAGNOSIS — M810 Age-related osteoporosis without current pathological fracture: Secondary | ICD-10-CM | POA: Diagnosis present

## 2016-11-28 DIAGNOSIS — E860 Dehydration: Secondary | ICD-10-CM | POA: Diagnosis present

## 2016-11-28 DIAGNOSIS — R531 Weakness: Secondary | ICD-10-CM

## 2016-11-28 DIAGNOSIS — F0390 Unspecified dementia without behavioral disturbance: Secondary | ICD-10-CM | POA: Diagnosis present

## 2016-11-28 DIAGNOSIS — E876 Hypokalemia: Secondary | ICD-10-CM | POA: Diagnosis not present

## 2016-11-28 DIAGNOSIS — Z6820 Body mass index (BMI) 20.0-20.9, adult: Secondary | ICD-10-CM

## 2016-11-28 DIAGNOSIS — R410 Disorientation, unspecified: Secondary | ICD-10-CM | POA: Diagnosis not present

## 2016-11-28 DIAGNOSIS — E43 Unspecified severe protein-calorie malnutrition: Secondary | ICD-10-CM | POA: Diagnosis present

## 2016-11-28 DIAGNOSIS — Z66 Do not resuscitate: Secondary | ICD-10-CM | POA: Diagnosis present

## 2016-11-28 DIAGNOSIS — J101 Influenza due to other identified influenza virus with other respiratory manifestations: Secondary | ICD-10-CM | POA: Diagnosis present

## 2016-11-28 DIAGNOSIS — Z880 Allergy status to penicillin: Secondary | ICD-10-CM

## 2016-11-28 DIAGNOSIS — E559 Vitamin D deficiency, unspecified: Secondary | ICD-10-CM | POA: Diagnosis present

## 2016-11-28 DIAGNOSIS — Z7982 Long term (current) use of aspirin: Secondary | ICD-10-CM

## 2016-11-28 DIAGNOSIS — I10 Essential (primary) hypertension: Secondary | ICD-10-CM | POA: Diagnosis present

## 2016-11-28 DIAGNOSIS — J208 Acute bronchitis due to other specified organisms: Secondary | ICD-10-CM | POA: Diagnosis present

## 2016-11-28 DIAGNOSIS — E785 Hyperlipidemia, unspecified: Secondary | ICD-10-CM | POA: Diagnosis present

## 2016-11-28 DIAGNOSIS — Z888 Allergy status to other drugs, medicaments and biological substances status: Secondary | ICD-10-CM

## 2016-11-28 HISTORY — DX: Other specified disorders of bone density and structure, unspecified site: M85.80

## 2016-11-28 HISTORY — DX: Essential (primary) hypertension: I10

## 2016-11-28 HISTORY — DX: Unspecified osteoarthritis, unspecified site: M19.90

## 2016-11-28 HISTORY — DX: Hyperlipidemia, unspecified: E78.5

## 2016-11-28 HISTORY — DX: Vitamin D deficiency, unspecified: E55.9

## 2016-11-28 LAB — URINALYSIS, ROUTINE W REFLEX MICROSCOPIC
BILIRUBIN URINE: NEGATIVE
Glucose, UA: NEGATIVE mg/dL
HGB URINE DIPSTICK: NEGATIVE
KETONES UR: NEGATIVE mg/dL
Leukocytes, UA: NEGATIVE
NITRITE: NEGATIVE
PH: 7 (ref 5.0–8.0)
Protein, ur: NEGATIVE mg/dL
Specific Gravity, Urine: 1.013 (ref 1.005–1.030)

## 2016-11-28 LAB — CBC
HCT: 41.6 % (ref 36.0–46.0)
Hemoglobin: 14.4 g/dL (ref 12.0–15.0)
MCH: 30.4 pg (ref 26.0–34.0)
MCHC: 34.6 g/dL (ref 30.0–36.0)
MCV: 87.9 fL (ref 78.0–100.0)
PLATELETS: 173 10*3/uL (ref 150–400)
RBC: 4.73 MIL/uL (ref 3.87–5.11)
RDW: 13.7 % (ref 11.5–15.5)
WBC: 10.2 10*3/uL (ref 4.0–10.5)

## 2016-11-28 LAB — MAGNESIUM
MAGNESIUM: 2.5 mg/dL — AB (ref 1.7–2.4)
Magnesium: 2.4 mg/dL (ref 1.7–2.4)

## 2016-11-28 LAB — COMPREHENSIVE METABOLIC PANEL
ALT: 32 U/L (ref 14–54)
AST: 73 U/L — AB (ref 15–41)
Albumin: 3.7 g/dL (ref 3.5–5.0)
Alkaline Phosphatase: 51 U/L (ref 38–126)
Anion gap: 13 (ref 5–15)
BUN: 29 mg/dL — AB (ref 6–20)
CHLORIDE: 94 mmol/L — AB (ref 101–111)
CO2: 30 mmol/L (ref 22–32)
Calcium: 8.8 mg/dL — ABNORMAL LOW (ref 8.9–10.3)
Creatinine, Ser: 1.37 mg/dL — ABNORMAL HIGH (ref 0.44–1.00)
GFR calc Af Amer: 38 mL/min — ABNORMAL LOW (ref >60)
GFR, EST NON AFRICAN AMERICAN: 32 mL/min — AB (ref >60)
GLUCOSE: 164 mg/dL — AB (ref 65–99)
Potassium: 2.3 mmol/L — CL (ref 3.5–5.1)
Sodium: 137 mmol/L (ref 135–145)
Total Bilirubin: 1.4 mg/dL — ABNORMAL HIGH (ref 0.3–1.2)
Total Protein: 8.1 g/dL (ref 6.5–8.1)

## 2016-11-28 LAB — TSH: TSH: 1.719 u[IU]/mL (ref 0.350–4.500)

## 2016-11-28 LAB — CBG MONITORING, ED: Glucose-Capillary: 151 mg/dL — ABNORMAL HIGH (ref 65–99)

## 2016-11-28 LAB — D-DIMER, QUANTITATIVE (NOT AT ARMC): D DIMER QUANT: 0.48 ug{FEU}/mL (ref 0.00–0.50)

## 2016-11-28 MED ORDER — ATORVASTATIN CALCIUM 40 MG PO TABS
40.0000 mg | ORAL_TABLET | Freq: Every day | ORAL | Status: DC
  Administered 2016-11-29 – 2016-11-30 (×2): 40 mg via ORAL
  Filled 2016-11-28 (×2): qty 1

## 2016-11-28 MED ORDER — ACETAMINOPHEN 325 MG PO TABS: 650.0000 mg | ORAL_TABLET | Freq: Four times a day (QID) | ORAL | Status: DC | PRN

## 2016-11-28 MED ORDER — ASPIRIN EC 81 MG PO TBEC
81.0000 mg | DELAYED_RELEASE_TABLET | Freq: Every day | ORAL | Status: DC
  Administered 2016-11-29 – 2016-11-30 (×2): 81 mg via ORAL
  Filled 2016-11-28 (×2): qty 1

## 2016-11-28 MED ORDER — IPRATROPIUM-ALBUTEROL 0.5-2.5 (3) MG/3ML IN SOLN
3.0000 mL | Freq: Once | RESPIRATORY_TRACT | Status: AC
  Administered 2016-11-28: 3 mL via RESPIRATORY_TRACT
  Filled 2016-11-28: qty 3

## 2016-11-28 MED ORDER — POTASSIUM CHLORIDE IN NACL 40-0.9 MEQ/L-% IV SOLN
INTRAVENOUS | Status: DC
  Administered 2016-11-28 – 2016-11-29 (×3): 75 mL/h via INTRAVENOUS
  Filled 2016-11-28 (×3): qty 1000

## 2016-11-28 MED ORDER — LEVALBUTEROL HCL 0.63 MG/3ML IN NEBU: 0.6300 mg | INHALATION_SOLUTION | Freq: Four times a day (QID) | RESPIRATORY_TRACT | Status: DC | PRN

## 2016-11-28 MED ORDER — POTASSIUM CHLORIDE 20 MEQ/15ML (10%) PO SOLN
40.0000 meq | Freq: Once | ORAL | Status: AC
  Administered 2016-11-28: 40 meq via ORAL
  Filled 2016-11-28: qty 30

## 2016-11-28 MED ORDER — ATENOLOL 50 MG PO TABS
100.0000 mg | ORAL_TABLET | Freq: Every day | ORAL | Status: DC
  Administered 2016-11-30: 100 mg via ORAL
  Filled 2016-11-28 (×2): qty 2

## 2016-11-28 MED ORDER — SENNOSIDES-DOCUSATE SODIUM 8.6-50 MG PO TABS: 1.0000 | ORAL_TABLET | Freq: Every evening | ORAL | Status: DC | PRN

## 2016-11-28 MED ORDER — MAGNESIUM SULFATE 2 GM/50ML IV SOLN
2.0000 g | Freq: Once | INTRAVENOUS | Status: AC
  Administered 2016-11-28: 2 g via INTRAVENOUS
  Filled 2016-11-28: qty 50

## 2016-11-28 MED ORDER — SODIUM CHLORIDE 0.9 % IV SOLN
30.0000 meq | Freq: Once | INTRAVENOUS | Status: AC
  Administered 2016-11-28: 30 meq via INTRAVENOUS
  Filled 2016-11-28: qty 15

## 2016-11-28 MED ORDER — ACETAMINOPHEN 650 MG RE SUPP: 650.0000 mg | Freq: Four times a day (QID) | RECTAL | Status: DC | PRN

## 2016-11-28 MED ORDER — ONDANSETRON HCL 4 MG PO TABS: 4.0000 mg | ORAL_TABLET | Freq: Four times a day (QID) | ORAL | Status: DC | PRN

## 2016-11-28 MED ORDER — SODIUM CHLORIDE 0.9 % IV BOLUS (SEPSIS)
1000.0000 mL | Freq: Once | INTRAVENOUS | Status: AC
  Administered 2016-11-28: 1000 mL via INTRAVENOUS

## 2016-11-28 MED ORDER — ENOXAPARIN SODIUM 30 MG/0.3ML ~~LOC~~ SOLN
30.0000 mg | SUBCUTANEOUS | Status: DC
  Administered 2016-11-28 – 2016-11-29 (×2): 30 mg via SUBCUTANEOUS
  Filled 2016-11-28 (×2): qty 0.3

## 2016-11-28 MED ORDER — ONDANSETRON HCL 4 MG/2ML IJ SOLN: 4.0000 mg | Freq: Four times a day (QID) | INTRAMUSCULAR | Status: DC | PRN

## 2016-11-28 NOTE — ED Notes (Signed)
Patient given water

## 2016-11-28 NOTE — ED Triage Notes (Signed)
Patient's son reports that over the last week patient has had decreased appetite, congestion, and slight increased confusion over baseline. Patient denies any complaints. Patient was seen at [facility] today and sent here to be evaluated.

## 2016-11-28 NOTE — H&P (Addendum)
[facility] History and Physical  [name] ZOX:[id number] DOB: Mar 14, 1922 DOA: 11/28/2016  Referring physician: PCP: Gaye Alken, MD   Chief Complaint: Weakness HPI:  83 year old female with a history of hypertension, dyslipidemia, osteoporosis, resident of independent living, presents to the ER due to decreased appetite, chest congestion, lethargy and increased confusion in the setting of baseline dementia. Patient's [facility] today   primary care physician, and was sent to the ER for further evaluation. According to the son patient has had some chest congestion and heavy breathing over the last 3-4 days. She is normally ambulatory with a walker, but has not been ambulating much, and has mostly been in bed for the last 3-4 days. He denies any fever, cough, denies chest pain In the ER patient was found to have coarse expiratory wheezing, potassium of 2.3, creatinine 1.37. She is being admitted for concerns of dehydration, poor oral intake, electrolyte abnormalities. Vital signs blood pressure 160/55, respirations 18, pulse 66, temperature 97.2      Review of Systems: negative for the following  Constitutional: Denies fever, chills, diaphoresis, appetite change and positive for fatigue.  HEENT: Denies photophobia, eye pain, redness, hearing loss, ear pain, congestion, sore throat, rhinorrhea, sneezing, mouth sores, trouble swallowing, neck pain, neck stiffness and tinnitus.  Respiratory: Denies SOB, DOE, cough, positive for chest tightness, and wheezing.  Cardiovascular: Denies chest pain, palpitations and leg swelling.  Gastrointestinal: Denies nausea, vomiting, abdominal pain, diarrhea, constipation, blood in stool and abdominal distention.  Genitourinary: Denies dysuria, urgency, frequency, hematuria, flank pain and difficulty urinating.  Musculoskeletal: Denies myalgias, back pain, joint swelling, arthralgias and gait problem.  Skin: Denies pallor, rash and  wound.  Neurological: Denies dizziness, seizures, syncope, weakness, light-headedness, numbness and headaches.  Hematological: Denies adenopathy. Easy bruising, personal or family bleeding history  Psychiatric/Behavioral: Denies suicidal ideation, mood changes, confusion, nervousness, sleep disturbance and agitation       Past Medical History:  Diagnosis Date  . Arthritis   . Hyperlipemia   . Hypertension   . Osteopenia   . Vitamin D deficiency      Past Surgical History:  Procedure Laterality Date  . SHOULDER SURGERY     Right      Social History:  reports that she has never smoked. She has never used smokeless tobacco. She reports that she does not drink alcohol. Her drug history is not on file.    Allergies  Allergen Reactions  . Ace Inhibitors     Unknown reaction   . Amoxicillin     Unknown reaction  Has patient had a PCN reaction causing immediate rash, facial/tongue/throat swelling, SOB or lightheadedness with hypotension: Unknown Has patient had a PCN reaction causing severe rash involving mucus membranes or skin necrosis: Unknown Has patient had a PCN reaction that required hospitalization: Unknown Has patient had a PCN reaction occurring within the last 10 years: Unknown If all of the above answers are "NO", then may proceed with Cephalosporin use.         FAMILY HISTORY  When questioned  Directly-patient reports  No family history of HTN, CVA ,DIABETES, TB, Cancer CAD, Bleeding Disorders, Sickle Cell, diabetes, anemia, asthma,   Prior to Admission medications   Medication Sig Start Date End Date Taking? Authorizing Provider  aspirin EC 81 MG tablet Take 81 mg by mouth daily.   Yes Historical Provider, MD  atenolol (TENORMIN) 100 MG tablet Take 100 mg by mouth daily.   Yes Historical Provider, MD  atorvastatin (LIPITOR) 40  MG tablet Take 40 mg by mouth daily.   Yes Historical Provider, MD  Cholecalciferol (VITAMIN D) 2000 units tablet Take 2,000 Units  by mouth daily.   Yes Historical Provider, MD  hydrochlorothiazide (MICROZIDE) 12.5 MG capsule Take 12.5 mg by mouth daily.   Yes Historical Provider, MD     Physical Exam: Vitals:   11/28/16 1320 11/28/16 1325 11/28/16 1330 11/28/16 1513  BP:   164/55 159/68  Pulse: 62 (!) 53  66  Resp: 14 14 17 21   Temp:      TempSrc:      SpO2: 100% 91%  95%  Weight:      Height:          Constitutional: NAD, calm, comfortable, In no respiratory distress Vitals:   11/28/16 1320 11/28/16 1325 11/28/16 1330 11/28/16 1513  BP:   164/55 159/68  Pulse: 62 (!) 53  66  Resp: 14 14 17 21   Temp:      TempSrc:      SpO2: 100% 91%  95%  Weight:      Height:       Eyes: PERRL, lids and conjunctivae normal, Dry mucous membranes ENMT:  Posterior pharynx clear of any exudate or lesions.Normal dentition.  Neck: normal, supple, no masses, no thyromegaly Respiratory: Mild expiratory wheezing, no crackles. Normal respiratory effort. No accessory muscle use.  Cardiovascular: Regular rate and rhythm, no murmurs / rubs / gallops. No extremity edema. 2+ pedal pulses. No carotid bruits.  Abdomen: no tenderness, no masses palpated. No hepatosplenomegaly. Bowel sounds positive.  Musculoskeletal: no clubbing / cyanosis. No joint deformity upper and lower extremities. Good ROM, no contractures. Normal muscle tone.  Skin: no rashes, lesions, ulcers. No induration Neurologic: CN 2-12 grossly intact. Sensation intact, DTR normal. Strength 5/5 in all 4.  Psychiatric: Normal judgment and insight. Alert and oriented x 3. Normal mood.     Labs on Admission: I have personally reviewed following labs and imaging studies  CBC:  Recent Labs Lab 11/28/16 1201  WBC 10.2  HGB 14.4  HCT 41.6  MCV 87.9  PLT 173    Basic Metabolic Panel:  Recent Labs Lab 11/28/16 1201  NA 137  K 2.3*  CL 94*  CO2 30  GLUCOSE 164*  BUN 29*  CREATININE 1.37*  CALCIUM 8.8*  MG 2.4    GFR: Estimated Creatinine Clearance:  25.1 mL/min (by C-G formula based on SCr of 1.37 mg/dL (H)).  Liver Function Tests:  Recent Labs Lab 11/28/16 1201  AST 73*  ALT 32  ALKPHOS 51  BILITOT 1.4*  PROT 8.1  ALBUMIN 3.7   No results for input(s): LIPASE, AMYLASE in the last 168 hours. No results for input(s): AMMONIA in the last 168 hours.  Coagulation Profile: No results for input(s): INR, PROTIME in the last 168 hours. No results for input(s): DDIMER in the last 72 hours.  Cardiac Enzymes: No results for input(s): CKTOTAL, CKMB, CKMBINDEX, TROPONINI in the last 168 hours.  BNP (last 3 results) No results for input(s): PROBNP in the last 8760 hours.  HbA1C: No results for input(s): HGBA1C in the last 72 hours. Lab Results  Component Value Date   HGBA1C (H) 08/30/2008    6.2 (NOTE)   The ADA recommends the following therapeutic goal for glycemic   control related to Hgb A1C measurement:   Goal of Therapy:   < 7.0% Hgb A1C   Reference: American Diabetes Association: Clinical Practice   Recommendations 2008, Diabetes Care,  2008,  31:(Suppl 1).     CBG:  Recent Labs Lab 11/28/16 1117  GLUCAP 151*    Lipid Profile: No results for input(s): CHOL, HDL, LDLCALC, TRIG, CHOLHDL, LDLDIRECT in the last 72 hours.  Thyroid Function Tests: No results for input(s): TSH, T4TOTAL, FREET4, T3FREE, THYROIDAB in the last 72 hours.  Anemia Panel: No results for input(s): VITAMINB12, FOLATE, FERRITIN, TIBC, IRON, RETICCTPCT in the last 72 hours.  Urine analysis:    Component Value Date/Time   COLORURINE YELLOW 11/28/2016 1459   APPEARANCEUR CLEAR 11/28/2016 1459   LABSPEC 1.013 11/28/2016 1459   PHURINE 7.0 11/28/2016 1459   GLUCOSEU NEGATIVE 11/28/2016 1459   HGBUR NEGATIVE 11/28/2016 1459   BILIRUBINUR NEGATIVE 11/28/2016 1459   KETONESUR NEGATIVE 11/28/2016 1459   PROTEINUR NEGATIVE 11/28/2016 1459   UROBILINOGEN 0.2 10/06/2008 1458   NITRITE NEGATIVE 11/28/2016 1459   LEUKOCYTESUR NEGATIVE 11/28/2016  1459    Sepsis Labs: @LABRCNTIP (procalcitonin:4,lacticidven:4) )No results found for this or any previous visit (from the past 240 hour(s)).       Radiological Exams on Admission: Dg Chest 2 View  Result Date: 11/28/2016 CLINICAL DATA:  Cough, decreased appetite EXAM: CHEST  2 VIEW COMPARISON:  02/22/2009 FINDINGS: Heart is borderline in size. No confluent airspace opacities or effusions. No acute bony abnormality. Prior right shoulder replacement, with old healed midshaft right humerus fracture near the tip of the right shoulder prosthesis, unchanged. IMPRESSION: Borderline heart size.  No active disease. Electronically Signed   By: Charlett NoseKevin  Dover M.D.   On: 11/28/2016 14:03   Ct Head Wo Contrast  Result Date: 11/28/2016 CLINICAL DATA:  Patient with history of dementia presenting with increasing confusion and decreased appetite. EXAM: CT HEAD WITHOUT CONTRAST TECHNIQUE: Contiguous axial images were obtained from the base of the skull through the vertex without intravenous contrast. COMPARISON:  October 06, 2008 FINDINGS: Brain: There is mild diffuse atrophy. There is no intracranial mass, hemorrhage, extra-axial fluid collection, or midline shift. There is patchy small vessel disease in the centra semiovale bilaterally. Elsewhere gray-white compartments appear normal. No acute infarct is evident. Vascular: There are no hyperdense vessels. There is calcification in both carotid siphon regions. There is also calcification in distal vertebral arteries bilaterally. Skull: Bony calvarium appears intact. Sinuses/Orbits: There is mucosal thickening in multiple ethmoid air cells bilaterally. Other paranasal sinuses which are visualized are clear. Orbits appear symmetric bilaterally. Other: Mastoid air cells are clear. IMPRESSION: Atrophy with patchy periventricular small vessel disease. No intracranial mass, hemorrhage, extra-axial fluid collection. No acute infarct evident. Multiple foci of arterial  vascular calcification. There is patchy ethmoid sinus disease bilaterally. Electronically Signed   By: Bretta BangWilliam  Woodruff III M.D.   On: 11/28/2016 16:18   Dg Chest 2 View  Result Date: 11/28/2016 CLINICAL DATA:  Cough, decreased appetite EXAM: CHEST  2 VIEW COMPARISON:  02/22/2009 FINDINGS: Heart is borderline in size. No confluent airspace opacities or effusions. No acute bony abnormality. Prior right shoulder replacement, with old healed midshaft right humerus fracture near the tip of the right shoulder prosthesis, unchanged. IMPRESSION: Borderline heart size.  No active disease. Electronically Signed   By: Charlett NoseKevin  Dover M.D.   On: 11/28/2016 14:03   Ct Head Wo Contrast  Result Date: 11/28/2016 CLINICAL DATA:  Patient with history of dementia presenting with increasing confusion and decreased appetite. EXAM: CT HEAD WITHOUT CONTRAST TECHNIQUE: Contiguous axial images were obtained from the base of the skull through the vertex without intravenous contrast. COMPARISON:  October 06, 2008 FINDINGS: Brain: There  is mild diffuse atrophy. There is no intracranial mass, hemorrhage, extra-axial fluid collection, or midline shift. There is patchy small vessel disease in the centra semiovale bilaterally. Elsewhere gray-white compartments appear normal. No acute infarct is evident. Vascular: There are no hyperdense vessels. There is calcification in both carotid siphon regions. There is also calcification in distal vertebral arteries bilaterally. Skull: Bony calvarium appears intact. Sinuses/Orbits: There is mucosal thickening in multiple ethmoid air cells bilaterally. Other paranasal sinuses which are visualized are clear. Orbits appear symmetric bilaterally. Other: Mastoid air cells are clear. IMPRESSION: Atrophy with patchy periventricular small vessel disease. No intracranial mass, hemorrhage, extra-axial fluid collection. No acute infarct evident. Multiple foci of arterial vascular calcification. There is patchy  ethmoid sinus disease bilaterally. Electronically Signed   By: Bretta Bang III M.D.   On: 11/28/2016 16:18      EKG: Independently reviewed.  Date/Time:                  Tuesday November 28 2016 12:50:14 EST Ventricular Rate:   54 PR Interval:                        QRS Duration:        108 QT Interval:                      535 QTC Calculation:    508 R Axis:                         80 Text Interpretation:  Sinus rhythm Atrial premature complex Short PR interval Prolonged QT interval prolonged QT new from previous  Assessment/Plan     Hypokalemia Likely in the setting of poor oral intake and use of HCTZ Admit to telemetry for observation Replete aggressively Check magnesium and replete as needed  Acute viral bronchitis Chest x-ray negative for pneumonia We'll check respiratory panel and use droplet precautions Nebulizer treatments as needed Patient has not had any significant mobility in the last 3 or 4 days. Will check d-dimer, if elevated will rule out PE      Essential hypertension-hold HCTZ, continue atenolol  Dehydration-baseline creatinine around 1.0. Presents with a creatinine of 1.3, patient will be started on IV fluids, hold HCTZ   DVT prophylaxis:  Lovenox     Code Status Orders Full code         consults called:None  Family Communication: Admission, patients condition and plan of care including tests being ordered have been discussed with the patient  who indicates understanding and agree with the plan and Code Status  Admission status: Observation  Disposition plan: Further plan will depend as patient's clinical course evolves and further radiologic and laboratory data become available. Likely home when stable     [facility] MD [facility] Pager [phone]  If 7PM-7AM, please contact night-coverage [email or web address] Password [email or web address]  11/28/2016, 4:53 PM

## 2016-11-28 NOTE — ED Notes (Signed)
Patient transported to X-ray 

## 2016-11-28 NOTE — ED Provider Notes (Signed)
[facility]-EMERGENCY DEPT Provider Note   CSN: [id number] Arrival date & time: 11/28/16 1047     History    Chief Complaint  Patient presents with  . Decreased Appetite  . Cough     HPI [name] is a 83 y.o. female.  83yo F w/ PMH including dementia, HTN, HLD who p/w weakness, confusion, and decreased appetite. Son reports that over the past one week she has had decreased appetite, poor oral intake, mildly increased confusion over the past few days, and a chest congestion sounds. He denies any significant cough or respiratory distress. He is not aware of any fevers, vomiting, or complaints of pain at her nursing facility. No new medications or changes in medicines. She was evaluated by her PCP today and was sent here due to concerns for dehydration.  LEVEL 5 CAVEAT DUE TO DEMENTIA  Past Medical History:  Diagnosis Date  . Arthritis   . Hyperlipemia   . Hypertension   . Osteopenia   . Vitamin D deficiency      Patient Active Problem List   Diagnosis Date Noted  . Hypokalemia 11/28/2016  . HYPERLIPIDEMIA 02/25/2009  . HYPERTENSION 02/25/2009  . PRURITUS 02/25/2009  . FROZEN RIGHT SHOULDER 02/25/2009  . OSTEOPOROSIS 02/25/2009  . PATHOLOGIC FRACTURE OF DISTAL RADIUS AND ULNA 02/25/2009  . ABNORMALITY OF GAIT 02/25/2009  . UNSPECIFIED CLOSED FRACTURE OF ANKLE 02/25/2009    Past Surgical History:  Procedure Laterality Date  . SHOULDER SURGERY     Right    OB History    No data available        Home Medications    Prior to Admission medications   Medication Sig Start Date End Date Taking? Authorizing Provider  aspirin EC 81 MG tablet Take 81 mg by mouth daily.   Yes Historical Provider, MD  atenolol (TENORMIN) 100 MG tablet Take 100 mg by mouth daily.   Yes Historical Provider, MD  atorvastatin (LIPITOR) 40 MG tablet Take 40 mg by mouth daily.   Yes Historical Provider, MD  Cholecalciferol (VITAMIN D) 2000 units tablet Take 2,000 Units by mouth daily.    Yes Historical Provider, MD  hydrochlorothiazide (MICROZIDE) 12.5 MG capsule Take 12.5 mg by mouth daily.   Yes Historical Provider, MD      No family history on file.   Social History  Substance Use Topics  . Smoking status: Never Smoker  . Smokeless tobacco: Never Used  . Alcohol use No     Allergies     Ace inhibitors and Amoxicillin    Review of Systems  Unable to obtain ROS 2/2 dementia   Physical Exam Updated Vital Signs BP 159/68   Pulse 66   Temp 97.2 F (36.2 C) (Oral)   Resp 21   Ht 5\' 4"  (1.626 m)   Wt 154 lb (69.9 kg)   SpO2 95%   BMI 26.43 kg/m   Physical Exam  Constitutional: No distress.  Frail elderly woman, awake and comfortable  HENT:  Head: Normocephalic and atraumatic.  Very dry mucous membranes  Eyes: Conjunctivae are normal. Pupils are equal, round, and reactive to light.  Neck: Neck supple.  Cardiovascular: Normal rate, regular rhythm and normal heart sounds.   No murmur heard. Pulmonary/Chest: Effort normal.  Coarse expiratory wheezes and rhonchi b/l  Abdominal: Soft. Bowel sounds are normal. She exhibits no distension. There is no tenderness.  Musculoskeletal: She exhibits no edema.  Neurological: She is alert.  Confused, but awake and able to follow  basic commands  Skin: Skin is warm and dry.  Nursing note and vitals reviewed.     ED Treatments / Results  Labs (all labs ordered are listed, but only abnormal results are displayed) Labs Reviewed  COMPREHENSIVE METABOLIC PANEL - Abnormal; Notable for the following:       Result Value   Potassium 2.3 (*)    Chloride 94 (*)    Glucose, Bld 164 (*)    BUN 29 (*)    Creatinine, Ser 1.37 (*)    Calcium 8.8 (*)    AST 73 (*)    Total Bilirubin 1.4 (*)    GFR calc non Af Amer 32 (*)    GFR calc Af Amer 38 (*)    All other components within normal limits  CBG MONITORING, ED - Abnormal; Notable for the following:    Glucose-Capillary 151 (*)    All other components  within normal limits  CBC  URINALYSIS, ROUTINE W REFLEX MICROSCOPIC  MAGNESIUM     EKG  EKG Interpretation  Date/Time:  Tuesday November 28 2016 12:50:14 EST Ventricular Rate:  54 PR Interval:    QRS Duration: 108 QT Interval:  535 QTC Calculation: 508 R Axis:   80 Text Interpretation:  Sinus rhythm Atrial premature complex Short PR interval Prolonged QT interval prolonged QT new from previous Confirmed by Spyros Winch MD, Carliss Porcaro [phone]) on 11/28/2016 2:58:47 PM Also confirmed by Kirstina Leinweber MD, Simren Popson [phone]), editor Valentina Lucks CT, Jola Babinski [phone])  on 11/28/2016 3:02:53 PM         Radiology Dg Chest 2 View  Result Date: 11/28/2016 CLINICAL DATA:  Cough, decreased appetite EXAM: CHEST  2 VIEW COMPARISON:  02/22/2009 FINDINGS: Heart is borderline in size. No confluent airspace opacities or effusions. No acute bony abnormality. Prior right shoulder replacement, with old healed midshaft right humerus fracture near the tip of the right shoulder prosthesis, unchanged. IMPRESSION: Borderline heart size.  No active disease. Electronically Signed   By: Charlett Nose M.D.   On: 11/28/2016 14:03    Procedures Procedures (including critical care time) Procedures  Medications Ordered in ED  Medications  potassium chloride 30 mEq in sodium chloride 0.9 % 265 mL (KCL MULTIRUN) IVPB (30 mEq Intravenous Given 11/28/16 1436)  sodium chloride 0.9 % bolus 1,000 mL (0 mLs Intravenous Stopped 11/28/16 1432)  magnesium sulfate IVPB 2 g 50 mL (0 g Intravenous Stopped 11/28/16 1432)  ipratropium-albuterol (DUONEB) 0.5-2.5 (3) MG/3ML nebulizer solution 3 mL (3 mLs Nebulization Given 11/28/16 1515)  potassium chloride 20 MEQ/15ML (10%) solution 40 mEq (40 mEq Oral Given 11/28/16 1515)     Initial Impression / Assessment and Plan / ED Course  I have reviewed the triage vital signs and the nursing notes.  Pertinent labs & imaging results that were available during my care of the patient were reviewed by me and considered in my  medical decision making (see chart for details).     Pt sent in from PCP office for concerns for dehydration; one week of worsening poor appetite and confusion from baseline. She was nontoxic on exam, mildly confused but in no acute distress. She was afebrile, heart rate in the 50s to 60s, reassuring BP. She did have rhonchi and coarse expiratory wheezes with no tachypnea or respiratory distress. Gave DuoNeb. Take above labs which were notable for potassium of 2.3, chloride 94, BUN 29, creatinine 1.37, AST 73, total bilirubin 1.4, normal CBC. Gave the patient IV potassium and magnesium repletion, IV fluid bolus, and oral potassium.  EKG shows prolonged QT compared to previous, likely 2/2 hypokalemia.  UA is normal. I am concerned about son's report of confusion from baseline in combination with AKI and profound hypokalemia w/ EKG showing prolonged QT. I have discussed observation admission w/ Dr. Susie Cassette, appreciate assistance. Pt admitted for further treatment.   Final Clinical Impressions(s) / ED Diagnoses   Final diagnoses:  Hypokalemia  AKI (acute kidney injury) (HCC)  Confusion  Weakness     New Prescriptions   No medications on file       Laurence Spates, MD 11/28/16 1541

## 2016-11-28 NOTE — ED Notes (Signed)
Patient transported to CT 

## 2016-11-29 DIAGNOSIS — R531 Weakness: Secondary | ICD-10-CM | POA: Diagnosis present

## 2016-11-29 DIAGNOSIS — E876 Hypokalemia: Secondary | ICD-10-CM | POA: Diagnosis present

## 2016-11-29 DIAGNOSIS — Z6820 Body mass index (BMI) 20.0-20.9, adult: Secondary | ICD-10-CM | POA: Diagnosis not present

## 2016-11-29 DIAGNOSIS — E785 Hyperlipidemia, unspecified: Secondary | ICD-10-CM | POA: Diagnosis present

## 2016-11-29 DIAGNOSIS — J208 Acute bronchitis due to other specified organisms: Secondary | ICD-10-CM | POA: Diagnosis present

## 2016-11-29 DIAGNOSIS — N179 Acute kidney failure, unspecified: Secondary | ICD-10-CM | POA: Diagnosis present

## 2016-11-29 DIAGNOSIS — I10 Essential (primary) hypertension: Secondary | ICD-10-CM | POA: Diagnosis present

## 2016-11-29 DIAGNOSIS — Z880 Allergy status to penicillin: Secondary | ICD-10-CM | POA: Diagnosis not present

## 2016-11-29 DIAGNOSIS — Z66 Do not resuscitate: Secondary | ICD-10-CM | POA: Diagnosis present

## 2016-11-29 DIAGNOSIS — Z7982 Long term (current) use of aspirin: Secondary | ICD-10-CM | POA: Diagnosis not present

## 2016-11-29 DIAGNOSIS — Z888 Allergy status to other drugs, medicaments and biological substances status: Secondary | ICD-10-CM | POA: Diagnosis not present

## 2016-11-29 DIAGNOSIS — F0390 Unspecified dementia without behavioral disturbance: Secondary | ICD-10-CM | POA: Diagnosis present

## 2016-11-29 DIAGNOSIS — E43 Unspecified severe protein-calorie malnutrition: Secondary | ICD-10-CM | POA: Diagnosis present

## 2016-11-29 DIAGNOSIS — M810 Age-related osteoporosis without current pathological fracture: Secondary | ICD-10-CM | POA: Diagnosis present

## 2016-11-29 DIAGNOSIS — E559 Vitamin D deficiency, unspecified: Secondary | ICD-10-CM | POA: Diagnosis present

## 2016-11-29 DIAGNOSIS — E860 Dehydration: Secondary | ICD-10-CM | POA: Diagnosis present

## 2016-11-29 DIAGNOSIS — J101 Influenza due to other identified influenza virus with other respiratory manifestations: Secondary | ICD-10-CM | POA: Diagnosis present

## 2016-11-29 LAB — RESPIRATORY PANEL BY PCR
ADENOVIRUS-RVPPCR: NOT DETECTED
Bordetella pertussis: NOT DETECTED
CORONAVIRUS NL63-RVPPCR: NOT DETECTED
Chlamydophila pneumoniae: NOT DETECTED
Coronavirus 229E: NOT DETECTED
Coronavirus HKU1: NOT DETECTED
Coronavirus OC43: NOT DETECTED
INFLUENZA A H3-RVPPCR: DETECTED — AB
Influenza B: NOT DETECTED
METAPNEUMOVIRUS-RVPPCR: NOT DETECTED
Mycoplasma pneumoniae: NOT DETECTED
PARAINFLUENZA VIRUS 2-RVPPCR: NOT DETECTED
PARAINFLUENZA VIRUS 3-RVPPCR: NOT DETECTED
PARAINFLUENZA VIRUS 4-RVPPCR: NOT DETECTED
Parainfluenza Virus 1: NOT DETECTED
RHINOVIRUS / ENTEROVIRUS - RVPPCR: NOT DETECTED
Respiratory Syncytial Virus: NOT DETECTED

## 2016-11-29 LAB — GLUCOSE, CAPILLARY: Glucose-Capillary: 101 mg/dL — ABNORMAL HIGH (ref 65–99)

## 2016-11-29 LAB — COMPREHENSIVE METABOLIC PANEL
ALBUMIN: 2.9 g/dL — AB (ref 3.5–5.0)
ALK PHOS: 41 U/L (ref 38–126)
ALT: 26 U/L (ref 14–54)
AST: 55 U/L — AB (ref 15–41)
Anion gap: 7 (ref 5–15)
BILIRUBIN TOTAL: 1.1 mg/dL (ref 0.3–1.2)
BUN: 13 mg/dL (ref 6–20)
CO2: 23 mmol/L (ref 22–32)
Calcium: 7.8 mg/dL — ABNORMAL LOW (ref 8.9–10.3)
Chloride: 107 mmol/L (ref 101–111)
Creatinine, Ser: 0.8 mg/dL (ref 0.44–1.00)
GFR calc Af Amer: >60 mL/min (ref >60)
GFR calc non Af Amer: >60 mL/min (ref >60)
GLUCOSE: 100 mg/dL — AB (ref 65–99)
POTASSIUM: 3.3 mmol/L — AB (ref 3.5–5.1)
Sodium: 137 mmol/L (ref 135–145)
TOTAL PROTEIN: 6.4 g/dL — AB (ref 6.5–8.1)

## 2016-11-29 LAB — CBC
HCT: 34.2 % — ABNORMAL LOW (ref 36.0–46.0)
Hemoglobin: 11.8 g/dL — ABNORMAL LOW (ref 12.0–15.0)
MCH: 29.6 pg (ref 26.0–34.0)
MCHC: 34.5 g/dL (ref 30.0–36.0)
MCV: 85.7 fL (ref 78.0–100.0)
Platelets: 139 10*3/uL — ABNORMAL LOW (ref 150–400)
RBC: 3.99 MIL/uL (ref 3.87–5.11)
RDW: 13.5 % (ref 11.5–15.5)
WBC: 10.5 10*3/uL (ref 4.0–10.5)

## 2016-11-29 MED ORDER — POTASSIUM CHLORIDE 20 MEQ/15ML (10%) PO SOLN: 40.0000 meq | Freq: Once | ORAL | Status: DC

## 2016-11-29 MED ORDER — LIP MEDEX EX OINT
TOPICAL_OINTMENT | CUTANEOUS | Status: DC | PRN
  Filled 2016-11-29: qty 7

## 2016-11-29 NOTE — Progress Notes (Signed)
PROGRESS NOTE  [name]  XBJ:[id number] DOB: 03/02/1922 DOA: 11/28/2016 PCP: Gaye AlkenBARNES,ELIZABETH STEWART, MD   Brief Narrative: [name] is a 83 y.o. female with a history of HTN, HLD, osteoporosis and dementia who presented to the ED 2/6 from independent living facility by way of PCP's office for decreased per oral intake, chest congestion, and lethargy. In the ED she was found to have coarse expiratory wheezing, potassium of 2.3, creatinine 1.37. Vital signs blood pressure 160/55, respirations 18, pulse 66, temperature 97.2. She was admitted for further work up and supportive therapy for dehydration and electrolyte abnormalities. Flu PCR was positive.   Assessment & Plan: Principal Problem:   Hypokalemia Active Problems:   Essential hypertension   Dehydration  Influenza: No PNA on CXR - Droplet precautions - Well outside window of benefit for tamiflu - Supportive therapy: Neb's prn - Will consult PT/OT as her mobility has been significantly affected, and may necessitate SNF placement at least short term. Her son agrees to this.   Dehydration: Due to poor po intake, possibly with AKI (Cr 1.3 on arrival, baseline possibly 1.0). Will rehydrate and determine current baseline creatinine.  - IVF, holding HCTZ  Hypokalemia: Poor po, thiazide. Improved with repletion, will continue repletiong, Mg not low.  - Hold thiazide, IVF, nutrition consult  HTN:  - Continue atenolol  DVT prophylaxis: Lovenox Code Status: Full Family Communication: Son at bedside Disposition Plan: Anticipate short term SNF vs. return to ILF in next 24-48 hours depending on clinical trajectory.  Consultants:   None  Procedures:   None  Antimicrobials:  None   Subjective: Pt confused, son at bedside reports ongoing cough with foamy phlegm.   Objective: Vitals:   11/28/16 2000 11/29/16 0530 11/29/16 1025 11/29/16 1421  BP: (!) 159/55 (!) 150/50 128/61 (!) 152/74  Pulse: 65 66 62 72  Resp: 18  18  18   Temp: 98.5 F (36.9 C) 98.2 F (36.8 C)  98.2 F (36.8 C)  TempSrc: Axillary Axillary  Oral  SpO2: 98% 98%  97%  Weight:      Height:        Intake/Output Summary (Last 24 hours) at 11/29/16 1818 Last data filed at 11/29/16 1700  Gross per 24 hour  Intake           2347.5 ml  Output                0 ml  Net           2347.5 ml   Filed Weights   11/28/16 1103 11/28/16 1713  Weight: 69.9 kg (154 lb) 55 kg (121 lb 4.1 oz)    Examination: General exam: Elderly, pleasant female in no distress  Respiratory system: Mildly labored breathing on room air with significant diffuse expiratory rhonchi. Moving good air.  Cardiovascular system: Regular rate and rhythm. No murmur, rub, or gallop. No JVD, and no pedal edema. Gastrointestinal system: Abdomen soft, non-tender, non-distended, with normoactive bowel sounds. No organomegaly or masses felt. Central nervous system: Alert and oriented. No focal neurological deficits. Extremities: Warm, no deformities Skin: No rashes, lesions no ulcers Psychiatry: Confused, alert. Mood & affect appropriate.   Data Reviewed: I have personally reviewed following labs and imaging studies  CBC:  Recent Labs Lab 11/28/16 1201 11/29/16 0521  WBC 10.2 10.5  HGB 14.4 11.8*  HCT 41.6 34.2*  MCV 87.9 85.7  PLT 173 139*   Basic Metabolic Panel:  Recent Labs Lab 11/28/16 1201 11/28/16 1733 11/29/16  0521  NA 137  --  137  K 2.3*  --  3.3*  CL 94*  --  107  CO2 30  --  23  GLUCOSE 164*  --  100*  BUN 29*  --  13  CREATININE 1.37*  --  0.80  CALCIUM 8.8*  --  7.8*  MG 2.4 2.5*  --    GFR: Estimated Creatinine Clearance: 38.7 mL/min (by C-G formula based on SCr of 0.8 mg/dL). Liver Function Tests:  Recent Labs Lab 11/28/16 1201 11/29/16 0521  AST 73* 55*  ALT 32 26  ALKPHOS 51 41  BILITOT 1.4* 1.1  PROT 8.1 6.4*  ALBUMIN 3.7 2.9*   No results for input(s): LIPASE, AMYLASE in the last 168 hours. No results for input(s):  AMMONIA in the last 168 hours. Coagulation Profile: No results for input(s): INR, PROTIME in the last 168 hours. Cardiac Enzymes: No results for input(s): CKTOTAL, CKMB, CKMBINDEX, TROPONINI in the last 168 hours. BNP (last 3 results) No results for input(s): PROBNP in the last 8760 hours. HbA1C: No results for input(s): HGBA1C in the last 72 hours. CBG:  Recent Labs Lab 11/28/16 1117  GLUCAP 151*   Lipid Profile: No results for input(s): CHOL, HDL, LDLCALC, TRIG, CHOLHDL, LDLDIRECT in the last 72 hours. Thyroid Function Tests:  Recent Labs  11/28/16 1733  TSH 1.719   Anemia Panel: No results for input(s): VITAMINB12, FOLATE, FERRITIN, TIBC, IRON, RETICCTPCT in the last 72 hours. Urine analysis:    Component Value Date/Time   COLORURINE YELLOW 11/28/2016 1459   APPEARANCEUR CLEAR 11/28/2016 1459   LABSPEC 1.013 11/28/2016 1459   PHURINE 7.0 11/28/2016 1459   GLUCOSEU NEGATIVE 11/28/2016 1459   HGBUR NEGATIVE 11/28/2016 1459   BILIRUBINUR NEGATIVE 11/28/2016 1459   KETONESUR NEGATIVE 11/28/2016 1459   PROTEINUR NEGATIVE 11/28/2016 1459   UROBILINOGEN 0.2 10/06/2008 1458   NITRITE NEGATIVE 11/28/2016 1459   LEUKOCYTESUR NEGATIVE 11/28/2016 1459   Recent Results (from the past 240 hour(s))  Respiratory Panel by PCR     Status: Abnormal   Collection Time: 11/28/16  5:00 PM  Result Value Ref Range Status   Adenovirus NOT DETECTED NOT DETECTED Final   Coronavirus 229E NOT DETECTED NOT DETECTED Final   Coronavirus HKU1 NOT DETECTED NOT DETECTED Final   Coronavirus NL63 NOT DETECTED NOT DETECTED Final   Coronavirus OC43 NOT DETECTED NOT DETECTED Final   Metapneumovirus NOT DETECTED NOT DETECTED Final   Rhinovirus / Enterovirus NOT DETECTED NOT DETECTED Final   Influenza A H3 DETECTED (A) NOT DETECTED Final   Influenza B NOT DETECTED NOT DETECTED Final   Parainfluenza Virus 1 NOT DETECTED NOT DETECTED Final   Parainfluenza Virus 2 NOT DETECTED NOT DETECTED Final    Parainfluenza Virus 3 NOT DETECTED NOT DETECTED Final   Parainfluenza Virus 4 NOT DETECTED NOT DETECTED Final   Respiratory Syncytial Virus NOT DETECTED NOT DETECTED Final   Bordetella pertussis NOT DETECTED NOT DETECTED Final   Chlamydophila pneumoniae NOT DETECTED NOT DETECTED Final   Mycoplasma pneumoniae NOT DETECTED NOT DETECTED Final    Comment: Performed at [facility], [address] [place], [place] [address]      Radiology Studies: Dg Chest 2 View  Result Date: 11/28/2016 CLINICAL DATA:  Cough, decreased appetite EXAM: CHEST  2 VIEW COMPARISON:  02/22/2009 FINDINGS: Heart is borderline in size. No confluent airspace opacities or effusions. No acute bony abnormality. Prior right shoulder replacement, with old healed midshaft right humerus fracture near  the tip of the right shoulder prosthesis, unchanged. IMPRESSION: Borderline heart size.  No active disease. Electronically Signed   By: Charlett Nose M.D.   On: 11/28/2016 14:03   Ct Head Wo Contrast  Result Date: 11/28/2016 CLINICAL DATA:  Patient with history of dementia presenting with increasing confusion and decreased appetite. EXAM: CT HEAD WITHOUT CONTRAST TECHNIQUE: Contiguous axial images were obtained from the base of the skull through the vertex without intravenous contrast. COMPARISON:  October 06, 2008 FINDINGS: Brain: There is mild diffuse atrophy. There is no intracranial mass, hemorrhage, extra-axial fluid collection, or midline shift. There is patchy small vessel disease in the centra semiovale bilaterally. Elsewhere gray-white compartments appear normal. No acute infarct is evident. Vascular: There are no hyperdense vessels. There is calcification in both carotid siphon regions. There is also calcification in distal vertebral arteries bilaterally. Skull: Bony calvarium appears intact. Sinuses/Orbits: There is mucosal thickening in multiple ethmoid air cells bilaterally. Other paranasal sinuses which are visualized are  clear. Orbits appear symmetric bilaterally. Other: Mastoid air cells are clear. IMPRESSION: Atrophy with patchy periventricular small vessel disease. No intracranial mass, hemorrhage, extra-axial fluid collection. No acute infarct evident. Multiple foci of arterial vascular calcification. There is patchy ethmoid sinus disease bilaterally. Electronically Signed   By: Bretta Bang III M.D.   On: 11/28/2016 16:18    Scheduled Meds: . aspirin EC  81 mg Oral Daily  . atenolol  100 mg Oral Daily  . atorvastatin  40 mg Oral Daily  . enoxaparin (LOVENOX) injection  30 mg Subcutaneous Q24H   Continuous Infusions: . 0.9 % NaCl with KCl 40 mEq / L 75 mL/hr (11/29/16 0730)     LOS: 0 days   Time spent: 25 minutes.  Hazeline Junker, MD [facility] Pager [phone]  If 7PM-7AM, please contact night-coverage [email or web address] Password [email or web address] 11/29/2016, 6:18 PM

## 2016-11-30 MED ORDER — BOOST / RESOURCE BREEZE PO LIQD: 1.0000 | Freq: Three times a day (TID) | ORAL | Status: DC

## 2016-11-30 MED ORDER — BOOST / RESOURCE BREEZE PO LIQD: 1.0000 | Freq: Three times a day (TID) | ORAL | 0 refills | Status: DC

## 2016-11-30 MED ORDER — SENNOSIDES-DOCUSATE SODIUM 8.6-50 MG PO TABS: 1.0000 | ORAL_TABLET | Freq: Every evening | ORAL | Status: AC | PRN

## 2016-11-30 NOTE — Discharge Summary (Signed)
Physician Discharge Summary  [name] ZOX:[id number] DOB: 10-11-1922 DOA: 11/28/2016  PCP: Gaye Alken, MD  Admit date: 11/28/2016 Discharge date: 11/30/2016  Admitted From: Home Disposition: [facility]   Recommendations for Outpatient Follow-up:  1. Follow up with PCP in 1-2 weeks 2. Please obtain BMP in 1 week to monitor hypokalemia 3. Monitor BP; HCTZ held and atenolol continued. Consider alternative anti-hypertensive. 4. Monitor po intake and consider appetite stimulants/protein supplementation. Continue boost TID between meals per nutrition recommendation. 5. Recommend ongoing consideration of risk/benefit of statin. Lipitor not discontinued at discharge.   Home Health: N/A Equipment/Devices: N/A Discharge Condition: Stable CODE STATUS: DNR confirmed at discharge Diet recommendation: Regular as tolerated  Brief/Interim Summary: [name] is a 83 y.o. female with a history of HTN, HLD, osteoporosis and dementia who presented to the ED 2/6 from independent living facility by way of PCP's office for decreased per oral intake, chest congestion, and lethargy. In the ED she was found to have coarse expiratory wheezing, potassium of 2.3, creatinine 1.37. Vital signs blood pressure 160/55, respirations 18, pulse 66, temperature 97.2. She was admitted for further work up and supportive therapy for dehydration and electrolyte abnormalities. Flu PCR was positive and she was treated supportively, never required oxygen. Her po intake picked up as she began feeling better.   Her Son relates a story of more chronic progression with poor po intake prior to the onset of this acute illness and believes he can no longer care for her at home. PT evaluated the patient and recommended SNF placement for rehabilitation. She will continue participation with physical therapy during convalescence, but the extent to which her appetite will return is yet to be seen. This was discussed with her  Son who is her power of attorney. He states she is a DNR, and will address the issue of poor po if it continues after improvement in flu symptoms.   Discharge Diagnoses:  Principal Problem:   Hypokalemia Active Problems:   Essential hypertension   Dehydration  Influenza: No PNA on CXR - Droplet precautions - Unfortunately diagnosed well outside window of benefit for tamiflu - Supportive therapy: Neb's prn  Dehydration with AKI: Due to poor po intake. Cr 1.3 on arrival, baseline presumed to be ~0.8 which is the value at discharge after IV hydration. Patient has demonstrated ability to take po without nausea or emesis.   Hypokalemia: Poor po, thiazide. Improved with repletion, will continue repletion, Mg not low.  - Holding thiazide - PO repletion - Will need recheck within 1 week  HTN:  - Continue atenolol, holding thiazide due to hypokalemia. Will require ongoing monitoring of BP.  Discharge Instructions Discharge Instructions    Discharge instructions    Complete by:  As directed    See discharge summary     Allergies as of 11/30/2016      Reactions   Ace Inhibitors    Unknown reaction    Amoxicillin    Unknown reaction  Has patient had a PCN reaction causing immediate rash, facial/tongue/throat swelling, SOB or lightheadedness with hypotension: Unknown Has patient had a PCN reaction causing severe rash involving mucus membranes or skin necrosis: Unknown Has patient had a PCN reaction that required hospitalization: Unknown Has patient had a PCN reaction occurring within the last 10 years: Unknown If all of the above answers are "NO", then may proceed with Cephalosporin use.      Medication List    STOP taking these medications   hydrochlorothiazide 12.5  MG capsule Commonly known as:  MICROZIDE     TAKE these medications   aspirin EC 81 MG tablet Take 81 mg by mouth daily.   atenolol 100 MG tablet Commonly known as:  TENORMIN Take 100 mg by mouth daily.    atorvastatin 40 MG tablet Commonly known as:  LIPITOR Take 40 mg by mouth daily.   feeding supplement Liqd Take 1 Container by mouth 3 (three) times daily between meals.   senna-docusate 8.6-50 MG tablet Commonly known as:  Senokot-S Take 1 tablet by mouth at bedtime as needed for mild constipation.   Vitamin D 2000 units tablet Take 2,000 Units by mouth daily.       Contact information for follow-up providers    Gaye Alken, MD Follow up.   Specialty:  Family Medicine Contact information: [address] [place] [address] [phone]            Contact information for after-discharge care    Destination    [facility] Follow up.   Specialty:  Skilled Nursing Facility Contact information: [address] [place] [address] [phone]                 Allergies  Allergen Reactions  . Ace Inhibitors     Unknown reaction   . Amoxicillin     Unknown reaction  Has patient had a PCN reaction causing immediate rash, facial/tongue/throat swelling, SOB or lightheadedness with hypotension: Unknown Has patient had a PCN reaction causing severe rash involving mucus membranes or skin necrosis: Unknown Has patient had a PCN reaction that required hospitalization: Unknown Has patient had a PCN reaction occurring within the last 10 years: Unknown If all of the above answers are "NO", then may proceed with Cephalosporin use.     Consultations:  None  Procedures/Studies: Dg Chest 2 View  Result Date: 11/28/2016 CLINICAL DATA:  Cough, decreased appetite EXAM: CHEST  2 VIEW COMPARISON:  02/22/2009 FINDINGS: Heart is borderline in size. No confluent airspace opacities or effusions. No acute bony abnormality. Prior right shoulder replacement, with old healed midshaft right humerus fracture near the tip of the right shoulder prosthesis, unchanged. IMPRESSION: Borderline heart size.  No active disease.  Electronically Signed   By: Charlett Nose M.D.   On: 11/28/2016 14:03   Ct Head Wo Contrast  Result Date: 11/28/2016 CLINICAL DATA:  Patient with history of dementia presenting with increasing confusion and decreased appetite. EXAM: CT HEAD WITHOUT CONTRAST TECHNIQUE: Contiguous axial images were obtained from the base of the skull through the vertex without intravenous contrast. COMPARISON:  October 06, 2008 FINDINGS: Brain: There is mild diffuse atrophy. There is no intracranial mass, hemorrhage, extra-axial fluid collection, or midline shift. There is patchy small vessel disease in the centra semiovale bilaterally. Elsewhere gray-white compartments appear normal. No acute infarct is evident. Vascular: There are no hyperdense vessels. There is calcification in both carotid siphon regions. There is also calcification in distal vertebral arteries bilaterally. Skull: Bony calvarium appears intact. Sinuses/Orbits: There is mucosal thickening in multiple ethmoid air cells bilaterally. Other paranasal sinuses which are visualized are clear. Orbits appear symmetric bilaterally. Other: Mastoid air cells are clear. IMPRESSION: Atrophy with patchy periventricular small vessel disease. No intracranial mass, hemorrhage, extra-axial fluid collection. No acute infarct evident. Multiple foci of arterial vascular calcification. There is patchy ethmoid sinus disease bilaterally. Electronically Signed   By: Bretta Bang III M.D.   On: 11/28/2016 16:18    Subjective: Patient  without complaints. Specifically denies trouble breathing, chest pain, palpitations, leg swelling. Ate breakfast this morning without nausea or vomiting.  Discharge Exam: Vitals:   11/30/16 0518 11/30/16 1000  BP: (!) 175/74   Pulse: 72 82  Resp: 18   Temp: 97.7 F (36.5 C)    General: Pt is alert, awake, confused but not in acute distress Cardiovascular: RRR, S1/S2 +, no rubs, no gallops Respiratory: Nonlabored breathing room air, no  wheezing, scattered rhonchi without crackles noted. Abdominal: Soft, NT, ND, bowel sounds + Extremities: No edema, no cyanosis  The results of significant diagnostics from this hospitalization (including imaging, microbiology, ancillary and laboratory) are listed below for reference.    Labs: Basic Metabolic Panel:  Recent Labs Lab 11/28/16 1201 11/28/16 1733 11/29/16 0521  NA 137  --  137  K 2.3*  --  3.3*  CL 94*  --  107  CO2 30  --  23  GLUCOSE 164*  --  100*  BUN 29*  --  13  CREATININE 1.37*  --  0.80  CALCIUM 8.8*  --  7.8*  MG 2.4 2.5*  --    Liver Function Tests:  Recent Labs Lab 11/28/16 1201 11/29/16 0521  AST 73* 55*  ALT 32 26  ALKPHOS 51 41  BILITOT 1.4* 1.1  PROT 8.1 6.4*  ALBUMIN 3.7 2.9*   CBC:  Recent Labs Lab 11/28/16 1201 11/29/16 0521  WBC 10.2 10.5  HGB 14.4 11.8*  HCT 41.6 34.2*  MCV 87.9 85.7  PLT 173 139*   CBG:  Recent Labs Lab 11/28/16 1117 11/29/16 2114  GLUCAP 151* 101*   D-Dimer  Recent Labs  11/28/16 1733  DDIMER 0.48   Thyroid function studies  Recent Labs  11/28/16 1733  TSH 1.719   Urinalysis    Component Value Date/Time   COLORURINE YELLOW 11/28/2016 1459   APPEARANCEUR CLEAR 11/28/2016 1459   LABSPEC 1.013 11/28/2016 1459   PHURINE 7.0 11/28/2016 1459   GLUCOSEU NEGATIVE 11/28/2016 1459   HGBUR NEGATIVE 11/28/2016 1459   BILIRUBINUR NEGATIVE 11/28/2016 1459   KETONESUR NEGATIVE 11/28/2016 1459   PROTEINUR NEGATIVE 11/28/2016 1459   UROBILINOGEN 0.2 10/06/2008 1458   NITRITE NEGATIVE 11/28/2016 1459   LEUKOCYTESUR NEGATIVE 11/28/2016 1459    Microbiology Recent Results (from the past 240 hour(s))  Respiratory Panel by PCR     Status: Abnormal   Collection Time: 11/28/16  5:00 PM  Result Value Ref Range Status   Adenovirus NOT DETECTED NOT DETECTED Final   Coronavirus 229E NOT DETECTED NOT DETECTED Final   Coronavirus HKU1 NOT DETECTED NOT DETECTED Final   Coronavirus NL63 NOT DETECTED  NOT DETECTED Final   Coronavirus OC43 NOT DETECTED NOT DETECTED Final   Metapneumovirus NOT DETECTED NOT DETECTED Final   Rhinovirus / Enterovirus NOT DETECTED NOT DETECTED Final   Influenza A H3 DETECTED (A) NOT DETECTED Final   Influenza B NOT DETECTED NOT DETECTED Final   Parainfluenza Virus 1 NOT DETECTED NOT DETECTED Final   Parainfluenza Virus 2 NOT DETECTED NOT DETECTED Final   Parainfluenza Virus 3 NOT DETECTED NOT DETECTED Final   Parainfluenza Virus 4 NOT DETECTED NOT DETECTED Final   Respiratory Syncytial Virus NOT DETECTED NOT DETECTED Final   Bordetella pertussis NOT DETECTED NOT DETECTED Final   Chlamydophila pneumoniae NOT DETECTED NOT DETECTED Final   Mycoplasma pneumoniae NOT DETECTED NOT DETECTED Final    Comment: Performed at [facility], [address] [place], [place] [address]  Time coordinating discharge: Approximately 40 minutes  Hazeline Junkeryan Marlena Barbato, MD  [facility] 11/30/2016, 1:19 PM Pager [phone]  If 7PM-7AM, please contact night-coverage [email or web address] Password [email or web address]

## 2016-11-30 NOTE — Clinical Social Work Placement (Signed)
Patient has a bed at [facility]. CSW has completed FL2 & will continue to follow and assist with discharge when ready.    Lincoln MaxinKelly Weslie Rasmus, LCSW [facility] Clinical Social Worker cell #: [phone]     CLINICAL SOCIAL WORK PLACEMENT  NOTE  Date:  11/30/2016  Patient Details  Name: [name] MRN: [id number] Date of Birth: 12/02/1921  Clinical Social Work is seeking post-discharge placement for this patient at the Skilled  Nursing Facility level of care (*CSW will initial, date and re-position this form in  chart as items are completed):  Yes   Patient/family provided with [facility] Clinical Social Work Department's list of facilities offering this level of care within the geographic area requested by the patient (or if unable, by the patient's family).  Yes   Patient/family informed of their freedom to choose among providers that offer the needed level of care, that participate in Medicare, Medicaid or managed care program needed by the patient, have an available bed and are willing to accept the patient.  Yes   Patient/family informed of [facility]'s ownership interest in [facility] and [facility], as well as of the fact that they are under no obligation to receive care at these facilities.  PASRR submitted to EDS on       PASRR number received on       Existing PASRR number confirmed on 11/30/16     FL2 transmitted to all facilities in geographic area requested by pt/family on 11/30/16     FL2 transmitted to all facilities within larger geographic area on       Patient informed that his/her managed care company has contracts with or will negotiate with certain facilities, including the following:        Yes   Patient/family informed of bed offers received.  [facility]     Physician recommends and patient chooses bed at      [facility] on  .  Patient to  be transferred to facility by       Patient family notified on   of transfer.  Name of family member notified:        PHYSICIAN       Additional Comment:    _______________________________________________ Arlyss RepressHarrison, [place] F, LCSW 11/30/2016, 11:19 AM

## 2016-11-30 NOTE — Progress Notes (Signed)
Report called to Artist Beachegina Cameron- nurse at [facility]. Pt D/C'd via EMS, son present at time of D/C.

## 2016-11-30 NOTE — Clinical Social Work Note (Signed)
Clinical Social Work Assessment  Patient Details  Name: [name] MRN: [id number] Date of Birth: 12-29-1921  Date of referral:  11/30/16               Reason for consult:  Facility Placement                Permission sought to share information with:  Facility Art therapist granted to share information::  Yes, Verbal Permission Granted  Name::        Agency::     Relationship::     Contact Information:     Housing/Transportation Living arrangements for the past 2 months:  [place] of Information:  Patient, Adult Children Patient Interpreter Needed:  None Criminal Activity/Legal Involvement Pertinent to Current Situation/Hospitalization:  No - Comment as needed Significant Relationships:  Adult Children Lives with:  Facility Resident Do you feel safe going back to the place where you live?  No Need for family participation in patient care:  Yes (Comment)  Care giving concerns:  CSW reviewed PT evaluation recommending SNF at discharge.    Social Worker assessment / plan:  CSW met with patient & son, Shanon Brow at bedside re: discharge planning - they are agreeable with plan for SNF. Son states that patient had been to [facility] in the past and would prefer that she go there. CSW confirmed with Suanne Marker at [facility] that they would be able to take patient. CSW submitted clinicals to [facility] for authorization. Anticipating discharge this afternoon.   Employment status:  Retired Nurse, adult PT Recommendations:  [place] / Referral to community resources:  [place]  Patient/Family's Response to care:    Patient/Family's Understanding of and Emotional Response to Diagnosis, Current Treatment, and Prognosis:    Emotional Assessment Appearance:  Appears stated age Attitude/Demeanor/Rapport:    Affect (typically observed):    Orientation:  Oriented to  Self Alcohol / Substance use:    Psych involvement (Current and /or in the community):     Discharge Needs  Concerns to be addressed:    Readmission within the last 30 days:    Current discharge risk:    Barriers to Discharge:      Standley Brooking, LCSW 11/30/2016, 11:17 AM

## 2016-11-30 NOTE — Evaluation (Signed)
Occupational Therapy Evaluation Patient Details Name: [name] MRN: [id number] DOB: 08/23/1922 Today's Date: 11/30/2016    History of Present Illness 83 year old female with a history of hypertension, dyslipidemia, osteoporosis, resident of independent living, presents to the ER due to decreased appetite, chest congestion, lethargy and increased confusion in the setting of baseline dementia and admitted for influenza and dehydration   Clinical Impression   Pt admitted with Flu and dehydration. Pt currently with functional limitations due to the deficits listed below (see OT Problem List). Pt will benefit from skilled OT to increase their safety and independence with ADL and functional mobility for ADL to facilitate discharge to venue listed below.      Follow Up Recommendations  SNF    Equipment Recommendations  None recommended by OT       Precautions / Restrictions Precautions Precautions: Fall      Mobility Bed Mobility Overal bed mobility: Needs Assistance Bed Mobility: Supine to Sit;Sit to Supine     Supine to sit: Mod assist Sit to supine: Min guard   General bed mobility comments: assist for LEs over EOB and trunk upright  Transfers Overall transfer level: Needs assistance Equipment used: 1 person hand held assist Transfers: Sit to/from Stand Sit to Stand: Mod assist         General transfer comment: assist to rise and steady    Balance Overall balance assessment: Needs assistance         Standing balance support: Single extremity supported;During functional activity Standing balance-Leahy Scale: Poor                              ADL Overall ADL's : Needs assistance/impaired                         Toilet Transfer: Minimal assistance;Moderate assistance;BSC   Toileting- Clothing Manipulation and Hygiene: Moderate assistance;Cueing for safety;Cueing for sequencing;Minimal assistance;Sit to/from stand          General ADL Comments: pt fatigued VERY quickly!    Pt needed to lie back down .  Sats 96-98 during entire OT session. Pt will need ST SNF for rehab               Pertinent Vitals/Pain Pain Assessment: No/denies pain     Hand Dominance     Extremity/Trunk Assessment Upper Extremity Assessment Upper Extremity Assessment: Generalized weakness   Lower Extremity Assessment Lower Extremity Assessment: Generalized weakness       Communication Communication Communication: HOH   Cognition Arousal/Alertness: Awake/alert Behavior During Therapy: Flat affect Overall Cognitive Status: History of cognitive impairments - at baseline (likely baseline)                                Home Living Family/patient expects to be discharged to:: Skilled nursing facility     Type of Home: Independent living facility                       Home Equipment: None          Prior Functioning/Environment          Comments: ambulatory without assistive device        OT Problem List: Decreased strength;Decreased activity tolerance;Impaired balance (sitting and/or standing)   OT Treatment/Interventions: Self-care/ADL training;DME and/or AE instruction;Patient/family education    OT  Goals(Current goals can be found in the care plan section)    OT Frequency: Min 2X/week   Barriers to D/C: Decreased caregiver support             End of Session Nurse Communication: Mobility status  Activity Tolerance: Patient tolerated treatment well Patient left: in bed;with call bell/phone within reach;with bed alarm set   Time: 1914-7829 OT Time Calculation (min): 12 min Charges:  OT General Charges $OT Visit: 1 Procedure OT Evaluation $OT Eval Moderate Complexity: 1 Procedure G-Codes:    Alba Cory 2016/12/27, 11:33 AM

## 2016-11-30 NOTE — Clinical Social Work Placement (Signed)
Patient is set to [facility] today. Blue Medicare authorization obtained Bjorn Loser- Rhonda at [facility] aware. Patient & son, Onalee HuaDavid at bedside made aware. Discharge packet given to RN, Konrad FelixKatelyn. [facility] called for transport.     Lincoln MaxinKelly Brenda Cowher, LCSW [facility] Clinical Social Worker cell #: [phone]    CLINICAL SOCIAL WORK PLACEMENT  NOTE  Date:  11/30/2016  Patient Details  Name: [name] MRN: [id number] Date of Birth: 05/01/1922  Clinical Social Work is seeking post-discharge placement for this patient at the Skilled  Nursing Facility level of care (*CSW will initial, date and re-position this form in  chart as items are completed):  Yes   Patient/family provided with [facility] Clinical Social Work Department's list of facilities offering this level of care within the geographic area requested by the patient (or if unable, by the patient's family).  Yes   Patient/family informed of their freedom to choose among providers that offer the needed level of care, that participate in Medicare, Medicaid or managed care program needed by the patient, have an available bed and are willing to accept the patient.  Yes   Patient/family informed of [facility]'s ownership interest in [facility] and [facility], as well as of the fact that they are under no obligation to receive care at these facilities.  PASRR submitted to EDS on       PASRR number received on       Existing PASRR number confirmed on 11/30/16     FL2 transmitted to all facilities in geographic area requested by pt/family on 11/30/16     FL2 transmitted to all facilities within larger geographic area on       Patient informed that his/her managed care company has contracts with or will negotiate with certain facilities, including the following:        Yes   Patient/family informed of bed offers received.  [facility]     Physician recommends  and patient chooses bed at      Patient to [facility] on 11/30/16.  Patient to be transferred to facility by [facility]     Patient family notified on 11/30/16 of transfer.  Name of family member notified:  patient's son, Onalee HuaDavid at bedside     PHYSICIAN       Additional Comment:    _______________________________________________ Arlyss RepressHarrison, Robi Dewolfe F, LCSW 11/30/2016, 1:49 PM

## 2016-11-30 NOTE — Progress Notes (Signed)
Initial Nutrition Assessment  DOCUMENTATION CODES:   Severe malnutrition in context of chronic illness  INTERVENTION:   Provide Boost Breeze po TID, each supplement provides 250 kcal and 9 grams of protein RD to continue to monitor  NUTRITION DIAGNOSIS:   Malnutrition related to chronic illness as evidenced by energy intake < or equal to 75% for > or equal to 1 month, percent weight loss, moderate depletion of body fat, severe depletion of muscle mass.  GOAL:   Patient will meet greater than or equal to 90% of their needs  MONITOR:   PO intake, Supplement acceptance, Labs, Weight trends, I & O's  REASON FOR ASSESSMENT:   Consult Assessment of nutrition requirement/status  ASSESSMENT:   83 y.o. female with a history of HTN, HLD, osteoporosis and dementia who presented to the ED 2/6 from independent living facility by way of PCP's office for decreased per oral intake, chest congestion, and lethargy. In the ED she was found to have coarse expiratory wheezing, potassium of 2.3, creatinine 1.37. Vital signs blood pressure 160/55, respirations 18, pulse 66, temperature 97.2. She was admitted for further work up and supportive therapy for dehydration and electrolyte abnormalities.   Patient in room with son at bedside. Pt with dementia. Pt currently consuming 10% of meals, she ate a few bites of sweet potato casserole and vegetable soup. Pt's son provided pt with some OJ to drink but pt did not want more than sip. Pt's appetite began declining in October of 2017. Pt's son will bring pt some snacks like crackers and cookies to snack on throughout the day. Pt started to not eat at mealtimes and eventually wouldn't eat the snack her son would bring for her.  Pt's son states that in October 2017, pt weighed ~132 lb. Pt weighed 126 lb on 2/6 when she went to the doctor for the flu.This would be a 11 lb weight loss, so 8% wt loss x 4 months, significant for time frame. Nutrition-Focused physical  exam completed. Findings are moderate fat depletion, severe muscle depletion, and no edema.   Medications reviewed. Labs reviewed: CBGs: 101 Low K Elevated Mg  Diet Order:  Diet Heart Room service appropriate? Yes; Fluid consistency: Thin  Skin:  Reviewed, no issues  Last BM:  PTA  Height:   Ht Readings from Last 1 Encounters:  11/28/16 5\' 4"  (1.626 m)    Weight:   Wt Readings from Last 1 Encounters:  11/28/16 121 lb 4.1 oz (55 kg)    Ideal Body Weight:  54.5 kg  BMI:  Body mass index is 20.81 kg/m.  Estimated Nutritional Needs:   Kcal:  1200-1400  Protein:  60-70g  Fluid:  1.4L/day  EDUCATION NEEDS:   No education needs identified at this time  Tilda FrancoLindsey Mattew Chriswell, MS, RD, LDN Pager: [phone] After Hours Pager: [phone]

## 2016-11-30 NOTE — Progress Notes (Signed)
PIV infiltrated to left FA. Area reddened, edematous, tender to touch. Skin intact. Hospitalist spoke of possible D/C today- OK with holding IVF. Will reevaluate after MD assesses patient.

## 2016-11-30 NOTE — Evaluation (Signed)
Physical Therapy Evaluation Patient Details Name: [name] MRN: [id number] DOB: 1922-01-05 Today's Date: 11/30/2016   History of Present Illness  83 year old female with a history of hypertension, dyslipidemia, osteoporosis, resident of independent living, presents to the ER due to decreased appetite, chest congestion, lethargy and increased confusion in the setting of baseline dementia and admitted for influenza and dehydration  Clinical Impression  Pt admitted with above diagnosis. Pt currently with functional limitations due to the deficits listed below (see PT Problem List). Pt will benefit from skilled PT to increase their independence and safety with mobility to allow discharge to the venue listed below.   Pt would benefit from ST-SNF prior to d/c back to ILF.  Pt presents with generalized weakness, limited endurance, and requiring at least min assist for mobility at this time.     Follow Up Recommendations SNF    Equipment Recommendations  None recommended by PT    Recommendations for Other Services       Precautions / Restrictions Precautions Precautions: Fall      Mobility  Bed Mobility Overal bed mobility: Needs Assistance Bed Mobility: Supine to Sit;Sit to Supine     Supine to sit: Mod assist Sit to supine: Min guard   General bed mobility comments: assist for LEs over EOB and trunk upright  Transfers Overall transfer level: Needs assistance Equipment used: 1 person hand held assist Transfers: Sit to/from Stand Sit to Stand: Min assist         General transfer comment: assist to rise and steady  Ambulation/Gait Ambulation/Gait assistance: Min assist Ambulation Distance (Feet): 10 Feet Assistive device: 1 person hand held assist Gait Pattern/deviations: Step-through pattern;Decreased stride length     General Gait Details: pt unsteady so provided HHA, only able to tolerate ambulating to/from bathroom, fatigues quickly  Stairs             Wheelchair Mobility    Modified Rankin (Stroke Patients Only)       Balance Overall balance assessment: Needs assistance         Standing balance support: Single extremity supported;During functional activity Standing balance-Leahy Scale: Poor                               Pertinent Vitals/Pain Pain Assessment: No/denies pain    Home Living Family/patient expects to be discharged to:: Skilled nursing facility     Type of Home: Independent living facility         Home Equipment: None      Prior Function           Comments: ambulatory without assistive device     Hand Dominance        Extremity/Trunk Assessment        Lower Extremity Assessment Lower Extremity Assessment: Generalized weakness       Communication   Communication: HOH  Cognition Arousal/Alertness: Awake/alert Behavior During Therapy: Flat affect Overall Cognitive Status: History of cognitive impairments - at baseline (likely baseline)                      General Comments      Exercises     Assessment/Plan    PT Assessment Patient needs continued PT services  PT Problem List Decreased strength;Decreased activity tolerance;Decreased balance;Decreased mobility          PT Treatment Interventions Therapeutic activities;Gait training;DME instruction;Therapeutic exercise;Patient/family education;Functional mobility training    PT Goals (  Current goals can be found in the Care Plan section)  Acute Rehab PT Goals PT Goal Formulation: With patient/family Time For Goal Achievement: 12/07/16 Potential to Achieve Goals: Good    Frequency Min 3X/week   Barriers to discharge        Co-evaluation               End of Session Equipment Utilized During Treatment: Gait belt Activity Tolerance: Patient limited by fatigue Patient left: in bed;with bed alarm set;with call bell/phone within reach Nurse Communication: Mobility status          Time: [id number] PT Time Calculation (min) (ACUTE ONLY): 13 min   Charges:   PT Evaluation $PT Eval Low Complexity: 1 Procedure     PT G Codes:        Jocob Dambach,KATHrine E 11/30/2016, 11:11 AM Zenovia JarredKati Anfernee Peschke, PT, DPT 11/30/2016 Pager: [phone]

## 2016-11-30 NOTE — Care Management Note (Signed)
Case Management Note  Patient Details  Name: Eugenie BirksLouise F Stfort MRN: [id number] Date of Birth: 03/04/1922  Subjective/Objective:CSW already following.                    Action/Plan:d/c SNF.   Expected Discharge Date:   (unknown)               Expected Discharge Plan:  Skilled Nursing Facility  In-House Referral:  Clinical Social Work  Discharge planning Services  CM Consult  Post Acute Care Choice:    Choice offered to:     DME Arranged:    DME Agency:     HH Arranged:    HH Agency:     Status of Service:  Completed, signed off  If discussed at [employer] Meetings, dates discussed:    Additional Comments:  Lanier ClamMahabir, Leylah Tarnow, RN 11/30/2016, 12:48 PM

## 2016-11-30 NOTE — [place] FL2 (Signed)
[place] MEDICAID FL2 LEVEL OF CARE SCREENING TOOL     IDENTIFICATION  Patient Name: Martha BirksLouise F Hibner Birthdate: 10/13/1922 Sex: female Admission Date (Current Location): 11/28/2016  [facility] [place]   Facility and Address:  [facility],  [address], [place] [id number]      Provider Number: [id number]  Attending Physician Name and Address:  Tyrone Nineyan B Grunz, MD  Relative Name and Phone Number:       Current Level of Care: Hospital Recommended Level of Care: Skilled Nursing Facility Prior Approval Number:    Date Approved/Denied:   PASRR Number: [id number] [id number]  Discharge Plan: SNF    Current Diagnoses: Patient Active Problem List   Diagnosis Date Noted  . Hypokalemia 11/28/2016  . Dehydration 11/28/2016  . AKI (acute kidney injury) (HCC)   . Confusion   . HYPERLIPIDEMIA 02/25/2009  . Essential hypertension 02/25/2009  . PRURITUS 02/25/2009  . FROZEN RIGHT SHOULDER 02/25/2009  . OSTEOPOROSIS 02/25/2009  . PATHOLOGIC FRACTURE OF DISTAL RADIUS AND ULNA 02/25/2009  . ABNORMALITY OF GAIT 02/25/2009  . UNSPECIFIED CLOSED FRACTURE OF ANKLE 02/25/2009    Orientation RESPIRATION BLADDER Height & Weight     Self  Normal Continent Weight: 121 lb 4.1 oz (55 kg) Height:  5\' 4"  (162.6 cm)  BEHAVIORAL SYMPTOMS/MOOD NEUROLOGICAL BOWEL NUTRITION STATUS      Continent Diet (Heart)  AMBULATORY STATUS COMMUNICATION OF NEEDS Skin   Extensive Assist Verbally Normal                       Personal Care Assistance Level of Assistance  Bathing, Dressing Bathing Assistance: Limited assistance   Dressing Assistance: Limited assistance     Functional Limitations Info             SPECIAL CARE FACTORS FREQUENCY  PT (By licensed PT), OT (By licensed OT)     PT Frequency: 5 OT Frequency: 5            Contractures      Additional Factors Info  Code Status, Allergies, Isolation Precautions Code Status Info: Fullcode Allergies Info:  Ace Inhibitors, Amoxicillin     Isolation Precautions Info: Droplet Precautions: positive for Influenza A 11/28/16 (per MD "Well outside window of benefit for tamiflu")     Current Medications (11/30/2016):  This is the current hospital active medication list Current Facility-Administered Medications  Medication Dose Route Frequency Provider Last Rate Last Dose  . 0.9 % NaCl with KCl 40 mEq / L  infusion   Intravenous Continuous Richarda OverlieNayana Abrol, MD   Stopped at 11/30/16 0815  . acetaminophen (TYLENOL) tablet 650 mg  650 mg Oral Q6H PRN Richarda OverlieNayana Abrol, MD       Or  . acetaminophen (TYLENOL) suppository 650 mg  650 mg Rectal Q6H PRN Richarda OverlieNayana Abrol, MD      . aspirin EC tablet 81 mg  81 mg Oral Daily Richarda OverlieNayana Abrol, MD   81 mg at 11/30/16 1021  . atenolol (TENORMIN) tablet 100 mg  100 mg Oral Daily Richarda OverlieNayana Abrol, MD   100 mg at 11/30/16 1021  . atorvastatin (LIPITOR) tablet 40 mg  40 mg Oral Daily Richarda OverlieNayana Abrol, MD   40 mg at 11/30/16 1021  . enoxaparin (LOVENOX) injection 30 mg  30 mg Subcutaneous Q24H Richarda OverlieNayana Abrol, MD   30 mg at 11/29/16 1753  . levalbuterol (XOPENEX) nebulizer solution 0.63 mg  0.63 mg Nebulization Q6H PRN Richarda OverlieNayana Abrol, MD      .  lip balm (CARMEX) ointment   Topical PRN Tyrone Nine, MD      . ondansetron [facility]) tablet 4 mg  4 mg Oral Q6H PRN Richarda Overlie, MD       Or  . ondansetron (ZOFRAN) injection 4 mg  4 mg Intravenous Q6H PRN Richarda Overlie, MD      . senna-docusate (Senokot-S) tablet 1 tablet  1 tablet Oral QHS PRN Richarda Overlie, MD         Discharge Medications: Please see discharge summary for a list of discharge medications.  Relevant Imaging Results:  Relevant Lab Results:   Additional Information SSN: [id number]  Arlyss Repress, LCSW

## 2016-12-01 ENCOUNTER — Non-Acute Institutional Stay (SKILLED_NURSING_FACILITY): Payer: Medicare Other | Admitting: Internal Medicine

## 2016-12-01 ENCOUNTER — Encounter: Payer: Self-pay | Admitting: Internal Medicine

## 2016-12-01 DIAGNOSIS — J45909 Unspecified asthma, uncomplicated: Secondary | ICD-10-CM | POA: Diagnosis not present

## 2016-12-01 DIAGNOSIS — E876 Hypokalemia: Secondary | ICD-10-CM | POA: Diagnosis not present

## 2016-12-01 DIAGNOSIS — R627 Adult failure to thrive: Secondary | ICD-10-CM

## 2016-12-01 DIAGNOSIS — J101 Influenza due to other identified influenza virus with other respiratory manifestations: Secondary | ICD-10-CM | POA: Diagnosis not present

## 2016-12-01 DIAGNOSIS — N179 Acute kidney failure, unspecified: Secondary | ICD-10-CM

## 2016-12-01 NOTE — Assessment & Plan Note (Signed)
Supplement ordered

## 2016-12-01 NOTE — Patient Instructions (Signed)
See assessment and plan under each diagnosis in the problem list and acutely for this visit 

## 2016-12-01 NOTE — Assessment & Plan Note (Signed)
Resolved

## 2016-12-01 NOTE — Progress Notes (Signed)
     This is a comprehensive admission note to [facility] performed on this date less than 30 days from date of admission. Included are preadmission medical/surgical history;reconciled medication list; family history; social history and comprehensive review of systems.  Corrections and additions to the records were documented . Comprehensive physical exam was also performed. Additionally a clinical summary was entered for each active diagnosis pertinent to this admission in the Problem List to enhance continuity of care.  PCP: Nicholes RoughElizabeth Barnes,MD,[place]  Code Status: DNR  HPI: Patient was hospitalized 2/6-11/30/16 ,sent to the hospital from her PCP's office for decreased oral intake, chest congestion, and lethargy. Auscultation revealed wheezing, but chest x-ray revealed no acute process. Respiratory panel did demonstrate positive stain for influenza A H33.Marland Kitchen. She was also found to be hypokalemic with a potassium of 2.3. Creatinine was 1.37 which is significant in view of her cachexia. Clinically she was felt to be dehydrated.  She had been in independent living facility, but her family felt she required increased level of care . PT recommended SNF placement for rehabilitation.. At discharge potassium was 3.3 and creatinine 0.8. Albumin was 2.9 total, protein 6.4. White blood count was normal. She exhibited a mild normochromic, normocytic anemia. Hemoglobin was 11.8 and hematocrit 34.2  Past medical and surgical history: Includes dementia, osteoporosis, vitamin D deficiency, hypertension, and dyslipidemia. She has a history of shoulder surgery  Social history: Epic reviewed; patient unable to verify  Family history: No data in chart ;patient cannot provide additional information. Family history is most likely noncontributory as she is [address].  Review of systems was attempted but could not be completed due to dementia. She was unable to give me the date. All replies were "I can't think ;  I'm trying to think"  She did state that she was short of breath.   Physical exam:  Pertinent or positive findings: She looks chronically ill and malnourished. She appears lethargic and is slow to respond. She had markedly decreased auditory acuity. There are herpetic lesions on the left upper lip and  left external nose. Oropharynx & tongue are very dry. She has coarse rhonchi in all lung fields. The heart rhythm is irregular and heard best in the epigastrium. She has 1+ pedal edema. Posterior tibial pulses are decreased.   General appearance: no acute distress , increased work of breathing is present.   Lymphatic: No lymphadenopathy about the head, neck, axilla . Eyes: No conjunctival inflammation or lid edema is present. There is no scleral icterus. Ears:  External ear exam shows no significant lesions or deformities.   Nose:  External nasal examination shows no deformity or inflammation. Nasal mucosa are pink and moist without lesions ,exudates Oral exam: lips and gums are healthy appearing.There is no oropharyngeal erythema or exudate . Neck:  No thyromegaly, masses, tenderness noted.    Heart:  No gallop, murmur, click, rub .  Abdomen:Bowel sounds are normal. Abdomen is soft and nontender with no organomegaly, hernias,masses. GU: deferred  Extremities:  No cyanosis, clubbing  Neurologic exam : Strength decreased  in upper & lower extremities Balance,Rhomberg,finger to nose testing could not be completed due to clinical state Deep tendon reflexes are equal Skin: Warm & dry w/o tenting. No significant rash.  See clinical summary under each active problem in the Problem List with associated updated therapeutic plan

## 2016-12-04 LAB — BASIC METABOLIC PANEL
BUN: 24 mg/dL — AB (ref 4–21)
CREATININE: 0.6 mg/dL (ref 0.5–1.1)
Glucose: 97 mg/dL
Potassium: 3.8 mmol/L (ref 3.4–5.3)
Sodium: 141 mmol/L (ref 137–147)

## 2016-12-04 NOTE — Progress Notes (Signed)
Clarification of diagnoses:   Patient had severe protein calorie malnutrition. The patient also had influenza and acute bronchitis.   Hazeline Junkeryan Burtis Imhoff, MD 12/04/2016 5:16 PM

## 2016-12-07 LAB — BASIC METABOLIC PANEL
BUN: 26 mg/dL — AB (ref 4–21)
Creatinine: 0.8 mg/dL (ref 0.5–1.1)
Glucose: 84 mg/dL
Potassium: 3.9 mmol/L (ref 3.4–5.3)
SODIUM: 143 mmol/L (ref 137–147)

## 2016-12-22 ENCOUNTER — Encounter: Payer: Self-pay | Admitting: Nurse Practitioner

## 2016-12-22 ENCOUNTER — Non-Acute Institutional Stay (SKILLED_NURSING_FACILITY): Payer: Medicare Other | Admitting: Nurse Practitioner

## 2016-12-22 DIAGNOSIS — J45909 Unspecified asthma, uncomplicated: Secondary | ICD-10-CM | POA: Diagnosis not present

## 2016-12-22 DIAGNOSIS — R627 Adult failure to thrive: Secondary | ICD-10-CM

## 2016-12-22 DIAGNOSIS — N179 Acute kidney failure, unspecified: Secondary | ICD-10-CM | POA: Diagnosis not present

## 2016-12-22 DIAGNOSIS — I10 Essential (primary) hypertension: Secondary | ICD-10-CM

## 2016-12-22 DIAGNOSIS — E876 Hypokalemia: Secondary | ICD-10-CM

## 2016-12-22 NOTE — Progress Notes (Signed)
Nursing Home Location:  [facility]  Place of Service: SNF (31)  PCP: Gaye Alken, MD  Allergies  Allergen Reactions  . Ace Inhibitors     Unknown reaction   . Amoxicillin     Unknown reaction  Has patient had a PCN reaction causing immediate rash, facial/tongue/throat swelling, SOB or lightheadedness with hypotension: Unknown Has patient had a PCN reaction causing severe rash involving mucus membranes or skin necrosis: Unknown Has patient had a PCN reaction that required hospitalization: Unknown Has patient had a PCN reaction occurring within the last 10 years: Unknown If all of the above answers are "NO", then may proceed with Cephalosporin use.     Chief Complaint  Patient presents with  . Discharge Note    Resident is discharging from SNF.     HPI:  Patient is a 83 y.o. female seen today at [facility] for discharge back to ALF. Pt with history of HTN, HLD, osteoporosis and dementia who went to the ED 2/6 from independent living facility by way of PCP's office for decreased per oral intake, chest congestion, and lethargy. In the ED shewas found to have coarse expiratory wheezing, potassium of 2.3, creatinine 1.37. Vital signs blood pressure 160/55, respirations 18, pulse 66, temperature 97.2. She was admitted for further work up and supportive therapy for dehydration and electrolyte abnormalities. Flu PCR was positive and she was treated supportively, never required oxygen. She was transferred to SNF for ongoing rehab. During hospitalization it was established she would need to transfer to ALF after discharge from SNF. Patient currently doing well with therapy, now stable to discharge home with home health.  Review of Systems:  Review of Systems  Unable to perform ROS: Dementia    Past Medical History:  Diagnosis Date  . Arthritis   . Hyperlipemia   . Hypertension   . Osteopenia   . Vitamin D deficiency    Past Surgical History:    Procedure Laterality Date  . SHOULDER SURGERY     Right   Social History:   reports that she has never smoked. She has never used smokeless tobacco. She reports that she does not drink alcohol or use drugs.  History reviewed. No pertinent family history.  Medications: Patient's Medications  New Prescriptions   No medications on file  Previous Medications   ASPIRIN EC 81 MG TABLET    Take 81 mg by mouth daily.   ATENOLOL (TENORMIN) 100 MG TABLET    Take 100 mg by mouth daily.   ATORVASTATIN (LIPITOR) 40 MG TABLET    Take 40 mg by mouth daily.   CHOLECALCIFEROL (VITAMIN D) 2000 UNITS TABLET    Take 2,000 Units by mouth daily.   IPRATROPIUM-ALBUTEROL (DUONEB) 0.5-2.5 (3) MG/3ML SOLN    Take 3 mLs by nebulization every 6 (six) hours as needed.   POTASSIUM CHLORIDE SA (K-DUR,KLOR-CON) 20 MEQ TABLET    Take 20 mEq by mouth daily.   SENNA-DOCUSATE (SENOKOT-S) 8.6-50 MG TABLET    Take 1 tablet by mouth at bedtime as needed for mild constipation.  Modified Medications   No medications on file  Discontinued Medications   FEEDING SUPPLEMENT (BOOST / RESOURCE BREEZE) LIQD    Take 1 Container by mouth 3 (three) times daily between meals.     Physical Exam: Vitals:   12/22/16 1054  BP: 118/75  Pulse: (!) 59  Resp: 12  Temp: 97.7 F (36.5 C)  SpO2: 97%  Weight: 120 lb (54.4 kg)  Height: 5\' 4"  (1.626 m)    Physical Exam  Constitutional: No distress.  Frail female   HENT:  Mouth/Throat: Oropharynx is clear and moist. No oropharyngeal exudate.  Eyes: Conjunctivae are normal. Pupils are equal, round, and reactive to light.  Neck: Normal range of motion. Neck supple.  Cardiovascular: Normal rate, regular rhythm and normal heart sounds.   Pulmonary/Chest: Effort normal and breath sounds normal.  Abdominal: Soft. Bowel sounds are normal.  Musculoskeletal: She exhibits no edema or tenderness.  Neurological: She is alert.  Skin: Skin is warm and dry. She is not diaphoretic.   Psychiatric: She has a normal mood and affect.    Labs reviewed: Basic Metabolic Panel:  Recent Labs  56/21/3001/04/09 1201 11/28/16 1733 11/29/16 0521 12/07/16  NA 137  --  137 143  K 2.3*  --  3.3* 3.9  CL 94*  --  107  --   CO2 30  --  23  --   GLUCOSE 164*  --  100*  --   BUN 29*  --  13 26*  CREATININE 1.37*  --  0.80 0.8  CALCIUM 8.8*  --  7.8*  --   MG 2.4 2.5*  --   --    Liver Function Tests:  Recent Labs  11/28/16 1201 11/29/16 0521  AST 73* 55*  ALT 32 26  ALKPHOS 51 41  BILITOT 1.4* 1.1  PROT 8.1 6.4*  ALBUMIN 3.7 2.9*   No results for input(s): LIPASE, AMYLASE in the last 8760 hours. No results for input(s): AMMONIA in the last 8760 hours. CBC:  Recent Labs  11/28/16 1201 11/29/16 0521  WBC 10.2 10.5  HGB 14.4 11.8*  HCT 41.6 34.2*  MCV 87.9 85.7  PLT 173 139*   TSH:  Recent Labs  11/28/16 1733  TSH 1.719   A1C: Lab Results  Component Value Date   HGBA1C (H) 08/30/2008    6.2 (NOTE)   The ADA recommends the following therapeutic goal for glycemic   control related to Hgb A1C measurement:   Goal of Therapy:   < 7.0% Hgb A1C   Reference: American Diabetes Association: Clinical Practice   Recommendations 2008, Diabetes Care,  2008, 31:(Suppl 1).   Lipid Panel: No results for input(s): CHOL, HDL, LDLCALC, TRIG, CHOLHDL, LDLDIRECT in the last 8760 hours.  Radiological Exams: Dg Chest 2 View  Result Date: 11/28/2016 CLINICAL DATA:  Cough, decreased appetite EXAM: CHEST  2 VIEW COMPARISON:  02/22/2009 FINDINGS: Heart is borderline in size. No confluent airspace opacities or effusions. No acute bony abnormality. Prior right shoulder replacement, with old healed midshaft right humerus fracture near the tip of the right shoulder prosthesis, unchanged. IMPRESSION: Borderline heart size.  No active disease. Electronically Signed   By: Charlett NoseKevin  Dover M.D.   On: 11/28/2016 14:03   Ct Head Wo Contrast  Result Date: 11/28/2016 CLINICAL DATA:  Patient with  history of dementia presenting with increasing confusion and decreased appetite. EXAM: CT HEAD WITHOUT CONTRAST TECHNIQUE: Contiguous axial images were obtained from the base of the skull through the vertex without intravenous contrast. COMPARISON:  October 06, 2008 FINDINGS: Brain: There is mild diffuse atrophy. There is no intracranial mass, hemorrhage, extra-axial fluid collection, or midline shift. There is patchy small vessel disease in the centra semiovale bilaterally. Elsewhere gray-white compartments appear normal. No acute infarct is evident. Vascular: There are no hyperdense vessels. There is calcification in both carotid siphon regions. There is also calcification in distal vertebral arteries bilaterally. Skull:  Bony calvarium appears intact. Sinuses/Orbits: There is mucosal thickening in multiple ethmoid air cells bilaterally. Other paranasal sinuses which are visualized are clear. Orbits appear symmetric bilaterally. Other: Mastoid air cells are clear. IMPRESSION: Atrophy with patchy periventricular small vessel disease. No intracranial mass, hemorrhage, extra-axial fluid collection. No acute infarct evident. Multiple foci of arterial vascular calcification. There is patchy ethmoid sinus disease bilaterally. Electronically Signed   By: Bretta Bang III M.D.   On: 11/28/2016 16:18    Assessment/Plan 1. Asthmatic bronchitis without complication, unspecified asthma severity, unspecified whether persistent stable.   2. Hypokalemia Improved with supplement, cont on potassium supplement. ollow up lab stable  3. AKI (acute kidney injury) (HCC)  Resolved, cont proper hydration.   4. Adult failure to thrive Cont supplement and supportive care, was not doing well at home prior to hospitalization so she will transition to ALF  5. Hypertension Off diuretic, blood pressure stable.   pt is stable for discharge to assisted living. -will need PT/OT per home health. No DME needed. Rx written.   will need to follow up with PCP within 2 weeks.     Janene Harvey. Biagio Borg  [facility] [phone] 8 am - 5 pm) [phone] (after hours)

## 2016-12-27 ENCOUNTER — Other Ambulatory Visit: Payer: Self-pay | Admitting: Family Medicine

## 2016-12-27 ENCOUNTER — Ambulatory Visit
Admission: RE | Admit: 2016-12-27 | Discharge: 2016-12-27 | Disposition: A | Discharge status: Home / Self Care | Payer: Medicare Other | Source: Ambulatory Visit | Attending: Family Medicine | Admitting: Family Medicine

## 2016-12-27 DIAGNOSIS — R531 Weakness: Secondary | ICD-10-CM

## 2016-12-27 DIAGNOSIS — M25551 Pain in right hip: Secondary | ICD-10-CM

## 2016-12-29 ENCOUNTER — Inpatient Hospital Stay (HOSPITAL_COMMUNITY)
Admission: EM | Admit: 2016-12-29 | Discharge: 2017-01-01 | DRG: 536 | Disposition: A | Discharge status: Skilled Nursing Facility | Payer: Medicare Other | Attending: Internal Medicine | Admitting: Internal Medicine

## 2016-12-29 ENCOUNTER — Emergency Department (HOSPITAL_COMMUNITY): Payer: Medicare Other

## 2016-12-29 ENCOUNTER — Encounter (HOSPITAL_COMMUNITY): Payer: Self-pay | Admitting: Emergency Medicine

## 2016-12-29 DIAGNOSIS — N179 Acute kidney failure, unspecified: Secondary | ICD-10-CM | POA: Diagnosis not present

## 2016-12-29 DIAGNOSIS — Z66 Do not resuscitate: Secondary | ICD-10-CM | POA: Diagnosis present

## 2016-12-29 DIAGNOSIS — M4696 Unspecified inflammatory spondylopathy, lumbar region: Secondary | ICD-10-CM | POA: Diagnosis present

## 2016-12-29 DIAGNOSIS — M810 Age-related osteoporosis without current pathological fracture: Secondary | ICD-10-CM | POA: Diagnosis present

## 2016-12-29 DIAGNOSIS — E440 Moderate protein-calorie malnutrition: Secondary | ICD-10-CM | POA: Diagnosis present

## 2016-12-29 DIAGNOSIS — Z6820 Body mass index (BMI) 20.0-20.9, adult: Secondary | ICD-10-CM

## 2016-12-29 DIAGNOSIS — E860 Dehydration: Secondary | ICD-10-CM | POA: Diagnosis not present

## 2016-12-29 DIAGNOSIS — S32509A Unspecified fracture of unspecified pubis, initial encounter for closed fracture: Secondary | ICD-10-CM | POA: Diagnosis not present

## 2016-12-29 DIAGNOSIS — I10 Essential (primary) hypertension: Secondary | ICD-10-CM | POA: Diagnosis present

## 2016-12-29 DIAGNOSIS — W19XXXA Unspecified fall, initial encounter: Secondary | ICD-10-CM

## 2016-12-29 DIAGNOSIS — S329XXA Fracture of unspecified parts of lumbosacral spine and pelvis, initial encounter for closed fracture: Secondary | ICD-10-CM | POA: Diagnosis present

## 2016-12-29 DIAGNOSIS — S32119A Unspecified Zone I fracture of sacrum, initial encounter for closed fracture: Secondary | ICD-10-CM | POA: Diagnosis present

## 2016-12-29 DIAGNOSIS — N390 Urinary tract infection, site not specified: Secondary | ICD-10-CM

## 2016-12-29 DIAGNOSIS — F0390 Unspecified dementia without behavioral disturbance: Secondary | ICD-10-CM | POA: Diagnosis present

## 2016-12-29 DIAGNOSIS — E559 Vitamin D deficiency, unspecified: Secondary | ICD-10-CM | POA: Diagnosis present

## 2016-12-29 DIAGNOSIS — S32591A Other specified fracture of right pubis, initial encounter for closed fracture: Secondary | ICD-10-CM | POA: Diagnosis not present

## 2016-12-29 DIAGNOSIS — Z1611 Resistance to penicillins: Secondary | ICD-10-CM | POA: Diagnosis present

## 2016-12-29 DIAGNOSIS — M25551 Pain in right hip: Secondary | ICD-10-CM | POA: Diagnosis not present

## 2016-12-29 DIAGNOSIS — B961 Klebsiella pneumoniae [K. pneumoniae] as the cause of diseases classified elsewhere: Secondary | ICD-10-CM | POA: Diagnosis present

## 2016-12-29 DIAGNOSIS — E876 Hypokalemia: Secondary | ICD-10-CM | POA: Diagnosis present

## 2016-12-29 DIAGNOSIS — T502X5A Adverse effect of carbonic-anhydrase inhibitors, benzothiadiazides and other diuretics, initial encounter: Secondary | ICD-10-CM | POA: Diagnosis present

## 2016-12-29 DIAGNOSIS — E785 Hyperlipidemia, unspecified: Secondary | ICD-10-CM | POA: Diagnosis present

## 2016-12-29 DIAGNOSIS — B9620 Unspecified Escherichia coli [E. coli] as the cause of diseases classified elsewhere: Secondary | ICD-10-CM | POA: Diagnosis present

## 2016-12-29 DIAGNOSIS — Z7982 Long term (current) use of aspirin: Secondary | ICD-10-CM

## 2016-12-29 LAB — I-STAT CHEM 8, ED
BUN: 35 mg/dL — AB (ref 6–20)
CHLORIDE: 94 mmol/L — AB (ref 101–111)
Calcium, Ion: 1.11 mmol/L — ABNORMAL LOW (ref 1.15–1.40)
Creatinine, Ser: 1.3 mg/dL — ABNORMAL HIGH (ref 0.44–1.00)
Glucose, Bld: 122 mg/dL — ABNORMAL HIGH (ref 65–99)
HEMATOCRIT: 39 % (ref 36.0–46.0)
Hemoglobin: 13.3 g/dL (ref 12.0–15.0)
POTASSIUM: 2.6 mmol/L — AB (ref 3.5–5.1)
Sodium: 134 mmol/L — ABNORMAL LOW (ref 135–145)
TCO2: 28 mmol/L (ref 0–100)

## 2016-12-29 MED ORDER — HYDROCODONE-ACETAMINOPHEN 5-325 MG PO TABS
0.5000 | ORAL_TABLET | Freq: Once | ORAL | Status: AC
  Administered 2016-12-29: 0.5 via ORAL
  Filled 2016-12-29: qty 1

## 2016-12-29 MED ORDER — HYDROCODONE-ACETAMINOPHEN 5-325 MG PO TABS
1.0000 | ORAL_TABLET | Freq: Once | ORAL | Status: AC
  Administered 2016-12-29: 1 via ORAL
  Filled 2016-12-29: qty 1

## 2016-12-29 NOTE — H&P (Signed)
History and Physical    [name] ZOX:[id number] DOB: [birth year] DOA: 12/29/2016  Referring MD/NP/PA:   PCP: Gaye Alken, MD   Patient coming from:  The patient is coming from independent living facility.  At baseline, pt is partially dependent for most of ADL.   Chief Complaint: right hip pain  HPI: [name] is a 83 y.o. female with medical history significant of dementia, hypertension, hyperlipidemia, osteopenia, arthritis, who presents with right hip pain.  Per patient's son, patient complains sulfa right hip pain since Tuesday. Not sure what happened, no fall witnessed per her son. Due to dementia, patient cannot provide detailed information. Patient was seen by PCP on Thursday and had x-ray of hip.  Friday PCP called and told patient's son the patient had a "subtle fracture to right hip." Follow-up appointment scheduled with orthopedics on Monday. Per her son, pt seems has persistent right hip pain and seems to be very uncomfortable. The pain is aggravated by movement or standing up. Pt does no complain of leg numbness. per her son, patient does not seem to have chest pain, SOB, fever, chills, nausea, vomiting, diarrhea or abdominal pain. No symptoms of UTI. Patient has decreased oral intake. She lost some body weight recently per her son.  ED Course: pt was found to have WBC 12.6, potassium 2.6, acute renal injury with creatinine 1.30, temperature normal, no tachycardia, oxygen saturation 90% on room air. Pt is admitted to med-surg bed as inpt.  # CT of pelvis showed: 1. Acute right parasymphyseal and symphysis fracture with minimal displacement and with extension into the symphysis pubis. 2. Additional tiny buckled right sacral alar fracture extending into the synovial portion of the right SI joint. 3. Lower lumbar degenerative facet arthropathy.  Review of Systems: Could not be reviewed accurately due to dementia.  Allergy:  Allergies  Allergen Reactions  .  Ace Inhibitors     Unknown reaction   . Amoxicillin     Unknown reaction  Has patient had a PCN reaction causing immediate rash, facial/tongue/throat swelling, SOB or lightheadedness with hypotension: Unknown Has patient had a PCN reaction causing severe rash involving mucus membranes or skin necrosis: Unknown Has patient had a PCN reaction that required hospitalization: Unknown Has patient had a PCN reaction occurring within the last 10 years: Unknown If all of the above answers are "NO", then may proceed with Cephalosporin use.     Past Medical History:  Diagnosis Date  . Arthritis   . Hyperlipemia   . Hypertension   . Osteopenia   . Vitamin D deficiency     Past Surgical History:  Procedure Laterality Date  . SHOULDER SURGERY     Right    Social History:  reports that she has never smoked. She has never used smokeless tobacco. She reports that she does not drink alcohol or use drugs.  Family History: Could not be reviewed accurately due to dementia  Prior to Admission medications   Medication Sig Start Date End Date Taking? Authorizing Provider  aspirin EC 81 MG tablet Take 81 mg by mouth daily.   Yes Historical Provider, MD  atenolol (TENORMIN) 100 MG tablet Take 100 mg by mouth daily.   Yes Historical Provider, MD  atorvastatin (LIPITOR) 40 MG tablet Take 40 mg by mouth daily.   Yes Historical Provider, MD  Cholecalciferol (VITAMIN D) 2000 units tablet Take 2,000 Units by mouth daily.   Yes Historical Provider, MD  hydrochlorothiazide (HYDRODIURIL) 12.5 MG tablet Take 12.5 mg  by mouth daily.  12/29/16  Yes Historical Provider, MD  senna-docusate (SENOKOT-S) 8.6-50 MG tablet Take 1 tablet by mouth at bedtime as needed for mild constipation. 11/30/16   Tyrone Nine, MD    Physical Exam: Vitals:   12/29/16 1952 12/29/16 2140 12/29/16 2347 12/29/16 2356  BP: (!) 119/46 137/67 141/89 141/89  Pulse: 68 (!) 59 62 62  Resp: 14 20  16   Temp:  97.9 F (36.6 C)    TempSrc:   Oral    SpO2: 94% 100% 97% 100%  Weight: 52.2 kg (115 lb)     Height: 5\' 3"  (1.6 m)      General: Not in acute distress.  HEENT:       Eyes: PERRL, EOMI, no scleral icterus.       ENT: No discharge from the ears and nose, no pharynx injection, no tonsillar enlargement.        Neck: No JVD, no bruit, no mass felt. Heme: No neck lymph node enlargement. Cardiac: S1/S2, RRR, No murmurs, No gallops or rubs. Respiratory: No rales, wheezing, rhonchi or rubs. GI: Soft, nondistended, nontender, no rebound pain, no organomegaly, BS present. GU: No hematuria Ext: No pitting leg edema bilaterally. 2+DP/PT pulse bilaterally. Musculoskeletal: has tenderness in right hip. Skin: No rashes.  Neuro: Alert, oriented to place and recognize her son, not oriented to time. Cranial nerves II-XII grossly intact, moves all extremities. Psych: Patient is not psychotic, no suicidal or hemocidal ideation.  Labs on Admission: I have personally reviewed following labs and imaging studies  CBC:  Recent Labs Lab 12/29/16 2346 12/29/16 2355  WBC 12.6*  --   NEUTROABS 8.4*  --   HGB 12.8 13.3  HCT 36.6 39.0  MCV 86.3  --   PLT 146*  --    Basic Metabolic Panel:  Recent Labs Lab 12/29/16 2355  NA 134*  K 2.6*  CL 94*  GLUCOSE 122*  BUN 35*  CREATININE 1.30*   GFR: Estimated Creatinine Clearance: 22.8 mL/min (by C-G formula based on SCr of 1.3 mg/dL (H)). Liver Function Tests: No results for input(s): AST, ALT, ALKPHOS, BILITOT, PROT, ALBUMIN in the last 168 hours. No results for input(s): LIPASE, AMYLASE in the last 168 hours. No results for input(s): AMMONIA in the last 168 hours. Coagulation Profile: No results for input(s): INR, PROTIME in the last 168 hours. Cardiac Enzymes: No results for input(s): CKTOTAL, CKMB, CKMBINDEX, TROPONINI in the last 168 hours. BNP (last 3 results) No results for input(s): PROBNP in the last 8760 hours. HbA1C: No results for input(s): HGBA1C in the last 72  hours. CBG: No results for input(s): GLUCAP in the last 168 hours. Lipid Profile: No results for input(s): CHOL, HDL, LDLCALC, TRIG, CHOLHDL, LDLDIRECT in the last 72 hours. Thyroid Function Tests: No results for input(s): TSH, T4TOTAL, FREET4, T3FREE, THYROIDAB in the last 72 hours. Anemia Panel: No results for input(s): VITAMINB12, FOLATE, FERRITIN, TIBC, IRON, RETICCTPCT in the last 72 hours. Urine analysis:    Component Value Date/Time   COLORURINE YELLOW 11/28/2016 1459   APPEARANCEUR CLEAR 11/28/2016 1459   LABSPEC 1.013 11/28/2016 1459   PHURINE 7.0 11/28/2016 1459   GLUCOSEU NEGATIVE 11/28/2016 1459   HGBUR NEGATIVE 11/28/2016 1459   BILIRUBINUR NEGATIVE 11/28/2016 1459   KETONESUR NEGATIVE 11/28/2016 1459   PROTEINUR NEGATIVE 11/28/2016 1459   UROBILINOGEN 0.2 10/06/2008 1458   NITRITE NEGATIVE 11/28/2016 1459   LEUKOCYTESUR NEGATIVE 11/28/2016 1459   Sepsis Labs: @LABRCNTIP (procalcitonin:4,lacticidven:4) )No results found for this  or any previous visit (from the past 240 hour(s)).   Radiological Exams on Admission: Ct Pelvis Wo Contrast  Result Date: 12/29/2016 CLINICAL DATA:  Right hip pain after fall. EXAM: CT PELVIS WITHOUT CONTRAST TECHNIQUE: Multidetector CT imaging of the pelvis was performed following the standard protocol without intravenous contrast. COMPARISON:  CXR 12/27/2016 FINDINGS: Urinary Tract:  No abnormality visualized. Bowel:  Unremarkable visualized pelvic bowel loops. Vascular/Lymphatic: Aorto bi-iliac atherosclerosis without aneurysm. Atherosclerosis of the internal iliac arteries bilaterally and right common femoral artery. No lymphadenopathy. Reproductive:  Uterus appears surgically absent.  No adnexal mass. Other:  None. Musculoskeletal: Minimal buckled appearing fracture of the right sacral ala along its lateral aspect adjacent to and extending into the synovial portion of the right SI joint. Acute closed with right parasymphysis and symphysis  fracture extending into the pubic symphysis. One 0 mild osteoarthritis of the AC joints. Lower lumbar facet arthropathy is noted Schmorl's node along the superior endplate of L5. IMPRESSION: 1. Acute right parasymphyseal and symphysis fracture with minimal displacement and with extension into the symphysis pubis confirming findings of prior radiographs. 2. Additional tiny buckled right sacral alar fracture extending into the synovial portion of the right SI joint. 3. Lower lumbar degenerative facet arthropathy. Electronically Signed   By: Tollie Ethavid  Kwon M.D.   On: 12/29/2016 23:20     EKG: Not done in ED, will get one.   Assessment/Plan Principal Problem:   Pelvic fracture (HCC) Active Problems:   HLD (hyperlipidemia)   Essential hypertension   Hypokalemia   Dehydration   AKI (acute kidney injury) (HCC)   Protein-calorie malnutrition, moderate (HCC)   Pelvic fracture (HCC): As shown by CT scan of the pelvis. Likely not need any surgery. We'll focus on pain control and physical therapy.  Follow-up appointment scheduled with orthopedics on Monday. - will admit to Med-surg bed as inpt (since pt may need rehabilitation facility placement) - Pain control: morphine prn and percocet - When necessary Zofran for nausea - Robaxin for muscle spasm - will get CT-head and C spin due to possible fall - consult to SW and CM  Leukocytosis: WBC 12.6. Likely due to stress-induced demargination. Patient does not have signs of infection. -Follow-up CBC  HLD: Last LDL was not on record -Continue home medications: Lipitor -Check FLP  HTN: -Continue atenolol -hold HCTZ due to AKI  Hypokalemia: K=2.4 on admission. - Repleted - 2g of magnesium sulfate was also ordered by EDP - Check Mg level  AKI: Likely due to prerenal secondary to dehydration and continuation of HCTZ - IVF: 1L NS and then 100 cc/h - Check  FeUrea - Follow up renal function by BMP - Hold Diuretics, HCTZ  Protein-calorie  malnutrition, moderate (HCC): -consult to Nutrition  DVT ppx: SQ Heparin   Code Status: DNR Family Communication: Yes, patient's  Son at bed side Disposition Plan:  Anticipate discharge back to rehabilitation facility Consults called:  none Admission status:  medical floor/inpt  Date of Service 12/30/2016    Lorretta HarpIU, Cashus Halterman [facility] Pager [phone]  If 7PM-7AM, please contact night-coverage [email or web address] Password [email or web address] 12/30/2016, 12:46 AM

## 2016-12-29 NOTE — ED Provider Notes (Signed)
[facility]-EMERGENCY DEPT Provider Note   CSN: [id number] Arrival date & time: 12/29/16  1738     History   Chief Complaint Chief Complaint  Patient presents with  . Hip Pain    HPI [name] is a 83 y.o. female.  HPI  83 year old female who is here with a week's worth of right pelvic pain. Suspect there is no known fall that she was walking normally one night in the next morning she was complaining of right hip pain. X-rays done that showed a pelvis fracture and has orthopedic follow-up scheduled for Monday. However today significant pain got worse so brought her here for further evaluation. No pain elsewhere. No other associated or modifying symptoms. Had been taking tylenol for relief, but not helping now.   Past Medical History:  Diagnosis Date  . Arthritis   . Hyperlipemia   . Hypertension   . Osteopenia   . Vitamin D deficiency     Patient Active Problem List   Diagnosis Date Noted  . Pelvic fracture (HCC) 12/30/2016  . Protein-calorie malnutrition, moderate (HCC) 12/30/2016  . Hypokalemia 11/28/2016  . Dehydration 11/28/2016  . AKI (acute kidney injury) (HCC)   . Confusion   . HLD (hyperlipidemia) 02/25/2009  . Essential hypertension 02/25/2009  . PRURITUS 02/25/2009  . FROZEN RIGHT SHOULDER 02/25/2009  . OSTEOPOROSIS 02/25/2009  . PATHOLOGIC FRACTURE OF DISTAL RADIUS AND ULNA 02/25/2009  . ABNORMALITY OF GAIT 02/25/2009  . UNSPECIFIED CLOSED FRACTURE OF ANKLE 02/25/2009    Past Surgical History:  Procedure Laterality Date  . SHOULDER SURGERY     Right    OB History    No data available       Home Medications    Prior to Admission medications   Medication Sig Start Date End Date Taking? Authorizing Provider  aspirin EC 81 MG tablet Take 81 mg by mouth daily.   Yes Historical Provider, MD  atenolol (TENORMIN) 100 MG tablet Take 100 mg by mouth daily.   Yes Historical Provider, MD  atorvastatin (LIPITOR) 40 MG tablet Take 40 mg by mouth daily.    Yes Historical Provider, MD  Cholecalciferol (VITAMIN D) 2000 units tablet Take 2,000 Units by mouth daily.   Yes Historical Provider, MD  hydrochlorothiazide (HYDRODIURIL) 12.5 MG tablet Take 12.5 mg by mouth daily.  12/29/16  Yes Historical Provider, MD  senna-docusate (SENOKOT-S) 8.6-50 MG tablet Take 1 tablet by mouth at bedtime as needed for mild constipation. 11/30/16   Tyrone Nine, MD    Family History History reviewed. No pertinent family history.  Social History Social History  Substance Use Topics  . Smoking status: Never Smoker  . Smokeless tobacco: Never Used  . Alcohol use No     Allergies   Ace inhibitors and Amoxicillin   Review of Systems Review of Systems  Unable to perform ROS: Dementia  All other systems reviewed and are negative.    Physical Exam Updated Vital Signs BP (!) 129/55 (BP Location: Right Arm)   Pulse 64   Temp 97.9 F (36.6 C) (Axillary)   Resp 16   Ht 5\' 3"  (1.6 m)   Wt 115 lb 1.3 oz (52.2 kg)   SpO2 99%   BMI 20.39 kg/m   Physical Exam  Constitutional: She appears well-developed and well-nourished.  HENT:  Head: Normocephalic and atraumatic.  Eyes: Conjunctivae and EOM are normal. Pupils are equal, round, and reactive to light.  Neck: Normal range of motion.  Cardiovascular: Normal rate and regular  rhythm.   Pulmonary/Chest: No stridor. No respiratory distress.  Abdominal: Soft. She exhibits no distension.  Musculoskeletal: Normal range of motion. She exhibits tenderness (with palpation of right pelvis).  No cervical spine tenderness, thoracic spine tenderness or Lumbar spine tenderness.  No tenderness or pain with palpation and full ROM of all joints in upper and lower extremities aside from right hip.  No ecchymosis or other signs of trauma on back or extremities.  No Pain with AP or lateral compression of ribs.  No Paracervical ttp, paraspinal ttp   Neurological: She is alert.  Skin: Skin is warm and dry.  Nursing note and  vitals reviewed.    ED Treatments / Results  Labs (all labs ordered are listed, but only abnormal results are displayed) Labs Reviewed  CBC WITH DIFFERENTIAL/PLATELET - Abnormal; Notable for the following:       Result Value   WBC 12.6 (*)    Platelets 146 (*)    Neutro Abs 8.4 (*)    Monocytes Absolute 1.5 (*)    All other components within normal limits  BASIC METABOLIC PANEL - Abnormal; Notable for the following:    Sodium 132 (*)    Potassium 2.5 (*)    Chloride 96 (*)    Glucose, Bld 118 (*)    BUN 37 (*)    Creatinine, Ser 1.35 (*)    GFR calc non Af Amer 33 (*)    GFR calc Af Amer 38 (*)    All other components within normal limits  MAGNESIUM - Abnormal; Notable for the following:    Magnesium 2.6 (*)    All other components within normal limits  CBC - Abnormal; Notable for the following:    WBC 11.4 (*)    Hemoglobin 11.9 (*)    HCT 35.2 (*)    Platelets 135 (*)    All other components within normal limits  BASIC METABOLIC PANEL - Abnormal; Notable for the following:    Sodium 133 (*)    Glucose, Bld 105 (*)    BUN 32 (*)    Creatinine, Ser 1.07 (*)    Calcium 8.1 (*)    GFR calc non Af Amer 44 (*)    GFR calc Af Amer 51 (*)    Anion gap 4 (*)    All other components within normal limits  URINALYSIS, ROUTINE W REFLEX MICROSCOPIC - Abnormal; Notable for the following:    APPearance HAZY (*)    Hgb urine dipstick SMALL (*)    Nitrite POSITIVE (*)    Leukocytes, UA TRACE (*)    Bacteria, UA MANY (*)    Squamous Epithelial / LPF 6-30 (*)    All other components within normal limits  I-STAT CHEM 8, ED - Abnormal; Notable for the following:    Sodium 134 (*)    Potassium 2.6 (*)    Chloride 94 (*)    BUN 35 (*)    Creatinine, Ser 1.30 (*)    Glucose, Bld 122 (*)    Calcium, Ion 1.11 (*)    All other components within normal limits  URINE CULTURE  URINE CULTURE  CREATININE, URINE, RANDOM  UREA NITROGEN, URINE    EKG  EKG Interpretation None         Radiology Ct Head Wo Contrast  Result Date: 12/30/2016 CLINICAL DATA:  Larey Seat last night. EXAM: CT HEAD WITHOUT CONTRAST CT CERVICAL SPINE WITHOUT CONTRAST TECHNIQUE: Multidetector CT imaging of the head and cervical spine was performed following  the standard protocol without intravenous contrast. Multiplanar CT image reconstructions of the cervical spine were also generated. COMPARISON:  11/28/2016 FINDINGS: CT HEAD FINDINGS Brain: There is no intracranial hemorrhage, mass or evidence of acute infarction. There is moderate generalized atrophy. There is moderate chronic microvascular ischemic change. There is no significant extra-axial fluid collection. No acute intracranial findings are evident. Vascular: No hyperdense vessel or unexpected calcification. Skull: Normal. Negative for fracture or focal lesion. Sinuses/Orbits: No acute finding. Other: None. CT CERVICAL SPINE FINDINGS Alignment: Normal. Skull base and vertebrae: No acute fracture. No primary bone lesion or focal pathologic process. Soft tissues and spinal canal: No prevertebral fluid or swelling. No visible canal hematoma. Disc levels: Moderate cervical degenerative disc disease at C6-7. Facet joints are arthritic, but intact. Mild degenerative spondylolisthesis at C5-6. Upper chest: Negative. Other: None IMPRESSION: 1. No acute intracranial findings. There is moderate generalized atrophy and chronic appearing white matter hypodensities which likely represent small vessel ischemic disease. 2. Negative for acute cervical spine fracture Electronically Signed   By: Ellery Plunkaniel R Mitchell M.D.   On: 12/30/2016 04:33   Ct Cervical Spine Wo Contrast  Result Date: 12/30/2016 CLINICAL DATA:  Larey SeatFell last night. EXAM: CT HEAD WITHOUT CONTRAST CT CERVICAL SPINE WITHOUT CONTRAST TECHNIQUE: Multidetector CT imaging of the head and cervical spine was performed following the standard protocol without intravenous contrast. Multiplanar CT image reconstructions of  the cervical spine were also generated. COMPARISON:  11/28/2016 FINDINGS: CT HEAD FINDINGS Brain: There is no intracranial hemorrhage, mass or evidence of acute infarction. There is moderate generalized atrophy. There is moderate chronic microvascular ischemic change. There is no significant extra-axial fluid collection. No acute intracranial findings are evident. Vascular: No hyperdense vessel or unexpected calcification. Skull: Normal. Negative for fracture or focal lesion. Sinuses/Orbits: No acute finding. Other: None. CT CERVICAL SPINE FINDINGS Alignment: Normal. Skull base and vertebrae: No acute fracture. No primary bone lesion or focal pathologic process. Soft tissues and spinal canal: No prevertebral fluid or swelling. No visible canal hematoma. Disc levels: Moderate cervical degenerative disc disease at C6-7. Facet joints are arthritic, but intact. Mild degenerative spondylolisthesis at C5-6. Upper chest: Negative. Other: None IMPRESSION: 1. No acute intracranial findings. There is moderate generalized atrophy and chronic appearing white matter hypodensities which likely represent small vessel ischemic disease. 2. Negative for acute cervical spine fracture Electronically Signed   By: Ellery Plunkaniel R Mitchell M.D.   On: 12/30/2016 04:33   Ct Pelvis Wo Contrast  Result Date: 12/29/2016 CLINICAL DATA:  Right hip pain after fall. EXAM: CT PELVIS WITHOUT CONTRAST TECHNIQUE: Multidetector CT imaging of the pelvis was performed following the standard protocol without intravenous contrast. COMPARISON:  CXR 12/27/2016 FINDINGS: Urinary Tract:  No abnormality visualized. Bowel:  Unremarkable visualized pelvic bowel loops. Vascular/Lymphatic: Aorto bi-iliac atherosclerosis without aneurysm. Atherosclerosis of the internal iliac arteries bilaterally and right common femoral artery. No lymphadenopathy. Reproductive:  Uterus appears surgically absent.  No adnexal mass. Other:  None. Musculoskeletal: Minimal buckled appearing  fracture of the right sacral ala along its lateral aspect adjacent to and extending into the synovial portion of the right SI joint. Acute closed with right parasymphysis and symphysis fracture extending into the pubic symphysis. One 0 mild osteoarthritis of the AC joints. Lower lumbar facet arthropathy is noted Schmorl's node along the superior endplate of L5. IMPRESSION: 1. Acute right parasymphyseal and symphysis fracture with minimal displacement and with extension into the symphysis pubis confirming findings of prior radiographs. 2. Additional tiny buckled right sacral alar fracture extending  into the synovial portion of the right SI joint. 3. Lower lumbar degenerative facet arthropathy. Electronically Signed   By: Tollie Eth M.D.   On: 12/29/2016 23:20    Procedures Procedures (including critical care time)  Medications Ordered in ED Medications  aspirin EC tablet 81 mg (not administered)  atenolol (TENORMIN) tablet 100 mg (not administered)  atorvastatin (LIPITOR) tablet 40 mg (not administered)  cholecalciferol (VITAMIN D) tablet 2,000 Units (not administered)  senna-docusate (Senokot-S) tablet 1 tablet (not administered)  ondansetron (ZOFRAN) injection 4 mg (not administered)  methocarbamol (ROBAXIN) tablet 500 mg (not administered)  0.9 %  sodium chloride infusion ( Intravenous New Bag/Given 12/30/16 0055)  heparin injection 5,000 Units (5,000 Units Subcutaneous Given 12/30/16 0532)  bisacodyl (DULCOLAX) EC tablet 5 mg (not administered)  zolpidem (AMBIEN) tablet 5 mg (not administered)  hydrALAZINE (APRESOLINE) injection 5 mg (not administered)  acetaminophen (TYLENOL) tablet 650 mg (not administered)  traMADol (ULTRAM) tablet 50 mg (not administered)  feeding supplement (ENSURE ENLIVE) (ENSURE ENLIVE) liquid 237 mL (not administered)  HYDROcodone-acetaminophen (NORCO/VICODIN) 5-325 MG per tablet 0.5 tablet (0.5 tablets Oral Given 12/29/16 2305)  HYDROcodone-acetaminophen  (NORCO/VICODIN) 5-325 MG per tablet 1 tablet (1 tablet Oral Given 12/29/16 2336)  sodium chloride 0.9 % bolus 1,000 mL (0 mLs Intravenous Stopped 12/30/16 0120)  potassium chloride 20 MEQ/15ML (10%) solution 40 mEq (40 mEq Oral Given 12/30/16 0013)  magnesium sulfate IVPB 2 g 50 mL (2 g Intravenous New Bag/Given 12/30/16 0025)  potassium chloride 10 mEq in 100 mL IVPB (10 mEq Intravenous New Bag/Given 12/30/16 0326)     Initial Impression / Assessment and Plan / ED Course  I have reviewed the triage vital signs and the nursing notes.  Pertinent labs & imaging results that were available during my care of the patient were reviewed by me and considered in my medical decision making (see chart for details).     Multiple pelvis fractures. Will admit for PT assessment/pain control, possible placement.   Final Clinical Impressions(s) / ED Diagnoses   Final diagnoses:  Fall  Fall      Marily Memos, MD 12/30/16 1214

## 2016-12-29 NOTE — ED Triage Notes (Signed)
Patient BIB son, reports he was called by staff on Tuesday saying patient was c/o right hip pain. Patient followed up with PCP and had an XR Thursday. Friday PCP called and told patient son the patient had a "subtle fracture to right hip." Follow-up appointment scheduled with orthopedics on Monday, however patient son is concerned patient cannot get comfortable and continues to complain of pain. Hx advanced dementia

## 2016-12-30 ENCOUNTER — Inpatient Hospital Stay (HOSPITAL_COMMUNITY): Payer: Medicare Other

## 2016-12-30 DIAGNOSIS — I10 Essential (primary) hypertension: Secondary | ICD-10-CM | POA: Diagnosis present

## 2016-12-30 DIAGNOSIS — Z6820 Body mass index (BMI) 20.0-20.9, adult: Secondary | ICD-10-CM | POA: Diagnosis not present

## 2016-12-30 DIAGNOSIS — S32509A Unspecified fracture of unspecified pubis, initial encounter for closed fracture: Secondary | ICD-10-CM | POA: Diagnosis not present

## 2016-12-30 DIAGNOSIS — F0390 Unspecified dementia without behavioral disturbance: Secondary | ICD-10-CM | POA: Diagnosis present

## 2016-12-30 DIAGNOSIS — E785 Hyperlipidemia, unspecified: Secondary | ICD-10-CM

## 2016-12-30 DIAGNOSIS — N179 Acute kidney failure, unspecified: Secondary | ICD-10-CM | POA: Diagnosis present

## 2016-12-30 DIAGNOSIS — W19XXXD Unspecified fall, subsequent encounter: Secondary | ICD-10-CM | POA: Diagnosis not present

## 2016-12-30 DIAGNOSIS — M25551 Pain in right hip: Secondary | ICD-10-CM | POA: Diagnosis present

## 2016-12-30 DIAGNOSIS — M4696 Unspecified inflammatory spondylopathy, lumbar region: Secondary | ICD-10-CM | POA: Diagnosis present

## 2016-12-30 DIAGNOSIS — E440 Moderate protein-calorie malnutrition: Secondary | ICD-10-CM | POA: Diagnosis present

## 2016-12-30 DIAGNOSIS — B961 Klebsiella pneumoniae [K. pneumoniae] as the cause of diseases classified elsewhere: Secondary | ICD-10-CM | POA: Diagnosis present

## 2016-12-30 DIAGNOSIS — E876 Hypokalemia: Secondary | ICD-10-CM | POA: Diagnosis present

## 2016-12-30 DIAGNOSIS — E860 Dehydration: Secondary | ICD-10-CM | POA: Diagnosis present

## 2016-12-30 DIAGNOSIS — Z1611 Resistance to penicillins: Secondary | ICD-10-CM | POA: Diagnosis present

## 2016-12-30 DIAGNOSIS — Z7982 Long term (current) use of aspirin: Secondary | ICD-10-CM | POA: Diagnosis not present

## 2016-12-30 DIAGNOSIS — T502X5A Adverse effect of carbonic-anhydrase inhibitors, benzothiadiazides and other diuretics, initial encounter: Secondary | ICD-10-CM | POA: Diagnosis present

## 2016-12-30 DIAGNOSIS — E559 Vitamin D deficiency, unspecified: Secondary | ICD-10-CM | POA: Diagnosis present

## 2016-12-30 DIAGNOSIS — S32119A Unspecified Zone I fracture of sacrum, initial encounter for closed fracture: Secondary | ICD-10-CM | POA: Diagnosis present

## 2016-12-30 DIAGNOSIS — N390 Urinary tract infection, site not specified: Secondary | ICD-10-CM | POA: Diagnosis present

## 2016-12-30 DIAGNOSIS — S32591A Other specified fracture of right pubis, initial encounter for closed fracture: Secondary | ICD-10-CM | POA: Diagnosis present

## 2016-12-30 DIAGNOSIS — W19XXXA Unspecified fall, initial encounter: Secondary | ICD-10-CM | POA: Diagnosis present

## 2016-12-30 DIAGNOSIS — N3000 Acute cystitis without hematuria: Secondary | ICD-10-CM | POA: Diagnosis not present

## 2016-12-30 DIAGNOSIS — Z66 Do not resuscitate: Secondary | ICD-10-CM | POA: Diagnosis present

## 2016-12-30 DIAGNOSIS — S329XXA Fracture of unspecified parts of lumbosacral spine and pelvis, initial encounter for closed fracture: Secondary | ICD-10-CM | POA: Diagnosis present

## 2016-12-30 DIAGNOSIS — B9620 Unspecified Escherichia coli [E. coli] as the cause of diseases classified elsewhere: Secondary | ICD-10-CM | POA: Diagnosis present

## 2016-12-30 DIAGNOSIS — M810 Age-related osteoporosis without current pathological fracture: Secondary | ICD-10-CM | POA: Diagnosis present

## 2016-12-30 LAB — BASIC METABOLIC PANEL
ANION GAP: 4 — AB (ref 5–15)
Anion gap: 9 (ref 5–15)
BUN: 32 mg/dL — AB (ref 6–20)
BUN: 37 mg/dL — AB (ref 6–20)
CALCIUM: 8.9 mg/dL (ref 8.9–10.3)
CHLORIDE: 104 mmol/L (ref 101–111)
CHLORIDE: 96 mmol/L — AB (ref 101–111)
CO2: 25 mmol/L (ref 22–32)
CO2: 27 mmol/L (ref 22–32)
CREATININE: 1.35 mg/dL — AB (ref 0.44–1.00)
Calcium: 8.1 mg/dL — ABNORMAL LOW (ref 8.9–10.3)
Creatinine, Ser: 1.07 mg/dL — ABNORMAL HIGH (ref 0.44–1.00)
GFR calc Af Amer: 38 mL/min — ABNORMAL LOW (ref >60)
GFR calc Af Amer: 51 mL/min — ABNORMAL LOW (ref >60)
GFR calc non Af Amer: 33 mL/min — ABNORMAL LOW (ref >60)
GFR, EST NON AFRICAN AMERICAN: 44 mL/min — AB (ref >60)
GLUCOSE: 118 mg/dL — AB (ref 65–99)
Glucose, Bld: 105 mg/dL — ABNORMAL HIGH (ref 65–99)
POTASSIUM: 3.8 mmol/L (ref 3.5–5.1)
Potassium: 2.5 mmol/L — CL (ref 3.5–5.1)
Sodium: 132 mmol/L — ABNORMAL LOW (ref 135–145)
Sodium: 133 mmol/L — ABNORMAL LOW (ref 135–145)

## 2016-12-30 LAB — CBC WITH DIFFERENTIAL/PLATELET
BASOS PCT: 0 %
Basophils Absolute: 0 10*3/uL (ref 0.0–0.1)
Eosinophils Absolute: 0.1 10*3/uL (ref 0.0–0.7)
Eosinophils Relative: 1 %
HEMATOCRIT: 36.6 % (ref 36.0–46.0)
HEMOGLOBIN: 12.8 g/dL (ref 12.0–15.0)
LYMPHS ABS: 2.6 10*3/uL (ref 0.7–4.0)
LYMPHS PCT: 20 %
MCH: 30.2 pg (ref 26.0–34.0)
MCHC: 35 g/dL (ref 30.0–36.0)
MCV: 86.3 fL (ref 78.0–100.0)
MONO ABS: 1.5 10*3/uL — AB (ref 0.1–1.0)
MONOS PCT: 12 %
NEUTROS ABS: 8.4 10*3/uL — AB (ref 1.7–7.7)
Neutrophils Relative %: 67 %
Platelets: 146 10*3/uL — ABNORMAL LOW (ref 150–400)
RBC: 4.24 MIL/uL (ref 3.87–5.11)
RDW: 15.1 % (ref 11.5–15.5)
WBC: 12.6 10*3/uL — ABNORMAL HIGH (ref 4.0–10.5)

## 2016-12-30 LAB — MAGNESIUM: Magnesium: 2.6 mg/dL — ABNORMAL HIGH (ref 1.7–2.4)

## 2016-12-30 LAB — URINALYSIS, ROUTINE W REFLEX MICROSCOPIC
BILIRUBIN URINE: NEGATIVE
Glucose, UA: NEGATIVE mg/dL
KETONES UR: NEGATIVE mg/dL
Nitrite: POSITIVE — AB
PH: 5 (ref 5.0–8.0)
Protein, ur: NEGATIVE mg/dL
SPECIFIC GRAVITY, URINE: 1.014 (ref 1.005–1.030)

## 2016-12-30 LAB — CBC
HCT: 35.2 % — ABNORMAL LOW (ref 36.0–46.0)
HEMOGLOBIN: 11.9 g/dL — AB (ref 12.0–15.0)
MCH: 29.9 pg (ref 26.0–34.0)
MCHC: 33.8 g/dL (ref 30.0–36.0)
MCV: 88.4 fL (ref 78.0–100.0)
Platelets: 135 10*3/uL — ABNORMAL LOW (ref 150–400)
RBC: 3.98 MIL/uL (ref 3.87–5.11)
RDW: 15.1 % (ref 11.5–15.5)
WBC: 11.4 10*3/uL — AB (ref 4.0–10.5)

## 2016-12-30 LAB — CREATININE, URINE, RANDOM: Creatinine, Urine: 108.61 mg/dL

## 2016-12-30 MED ORDER — BISACODYL 5 MG PO TBEC
5.0000 mg | DELAYED_RELEASE_TABLET | Freq: Every day | ORAL | Status: DC | PRN
  Filled 2016-12-30: qty 1

## 2016-12-30 MED ORDER — POTASSIUM CHLORIDE 10 MEQ/100ML IV SOLN
10.0000 meq | INTRAVENOUS | Status: AC
  Administered 2016-12-30 (×3): 10 meq via INTRAVENOUS
  Filled 2016-12-30 (×3): qty 100

## 2016-12-30 MED ORDER — SENNOSIDES-DOCUSATE SODIUM 8.6-50 MG PO TABS: 1.0000 | ORAL_TABLET | Freq: Every evening | ORAL | Status: DC | PRN

## 2016-12-30 MED ORDER — ACETAMINOPHEN 325 MG PO TABS
650.0000 mg | ORAL_TABLET | Freq: Three times a day (TID) | ORAL | Status: DC
  Administered 2016-12-30 – 2017-01-01 (×7): 650 mg via ORAL
  Filled 2016-12-30 (×7): qty 2

## 2016-12-30 MED ORDER — SODIUM CHLORIDE 0.9 % IV SOLN
INTRAVENOUS | Status: DC
  Administered 2016-12-30 – 2016-12-31 (×2): via INTRAVENOUS

## 2016-12-30 MED ORDER — ASPIRIN EC 81 MG PO TBEC
81.0000 mg | DELAYED_RELEASE_TABLET | Freq: Every day | ORAL | Status: DC
  Administered 2016-12-30 – 2017-01-01 (×3): 81 mg via ORAL
  Filled 2016-12-30 (×3): qty 1

## 2016-12-30 MED ORDER — TRAMADOL HCL 50 MG PO TABS
50.0000 mg | ORAL_TABLET | Freq: Four times a day (QID) | ORAL | Status: DC | PRN
  Administered 2016-12-31 – 2017-01-01 (×2): 50 mg via ORAL
  Filled 2016-12-30 (×2): qty 1

## 2016-12-30 MED ORDER — MORPHINE SULFATE (PF) 4 MG/ML IV SOLN: 2.0000 mg | INTRAVENOUS | Status: DC | PRN

## 2016-12-30 MED ORDER — ZOLPIDEM TARTRATE 5 MG PO TABS: 5.0000 mg | ORAL_TABLET | Freq: Every evening | ORAL | Status: DC | PRN

## 2016-12-30 MED ORDER — MAGNESIUM SULFATE 2 GM/50ML IV SOLN
2.0000 g | Freq: Once | INTRAVENOUS | Status: AC
  Administered 2016-12-30: 2 g via INTRAVENOUS
  Filled 2016-12-30: qty 50

## 2016-12-30 MED ORDER — VITAMIN D 1000 UNITS PO TABS
2000.0000 [IU] | ORAL_TABLET | Freq: Every day | ORAL | Status: DC
  Administered 2016-12-30 – 2017-01-01 (×3): 2000 [IU] via ORAL
  Filled 2016-12-30 (×3): qty 2

## 2016-12-30 MED ORDER — ATORVASTATIN CALCIUM 20 MG PO TABS
40.0000 mg | ORAL_TABLET | Freq: Every day | ORAL | Status: DC
  Administered 2016-12-30 – 2017-01-01 (×3): 40 mg via ORAL
  Filled 2016-12-30 (×3): qty 2

## 2016-12-30 MED ORDER — ATENOLOL 25 MG PO TABS
100.0000 mg | ORAL_TABLET | Freq: Every day | ORAL | Status: DC
  Administered 2016-12-30: 100 mg via ORAL
  Filled 2016-12-30: qty 4

## 2016-12-30 MED ORDER — ENSURE ENLIVE PO LIQD
237.0000 mL | Freq: Two times a day (BID) | ORAL | Status: DC
  Administered 2016-12-30 – 2017-01-01 (×4): 237 mL via ORAL

## 2016-12-30 MED ORDER — ATENOLOL 25 MG PO TABS
50.0000 mg | ORAL_TABLET | Freq: Every day | ORAL | Status: DC
  Administered 2016-12-31 – 2017-01-01 (×2): 50 mg via ORAL
  Filled 2016-12-30 (×2): qty 2

## 2016-12-30 MED ORDER — HYDROCODONE-ACETAMINOPHEN 5-325 MG PO TABS: 1.0000 | ORAL_TABLET | Freq: Four times a day (QID) | ORAL | Status: DC | PRN

## 2016-12-30 MED ORDER — HYDRALAZINE HCL 20 MG/ML IJ SOLN: 5.0000 mg | INTRAMUSCULAR | Status: DC | PRN

## 2016-12-30 MED ORDER — POTASSIUM CHLORIDE 20 MEQ/15ML (10%) PO SOLN
40.0000 meq | Freq: Once | ORAL | Status: AC
  Administered 2016-12-30: 40 meq via ORAL
  Filled 2016-12-30: qty 30

## 2016-12-30 MED ORDER — HYDROCHLOROTHIAZIDE 12.5 MG PO CAPS: 12.5000 mg | ORAL_CAPSULE | Freq: Every day | ORAL | Status: DC

## 2016-12-30 MED ORDER — SODIUM CHLORIDE 0.9 % IV BOLUS (SEPSIS)
1000.0000 mL | Freq: Once | INTRAVENOUS | Status: AC
  Administered 2016-12-30: 1000 mL via INTRAVENOUS

## 2016-12-30 MED ORDER — HEPARIN SODIUM (PORCINE) 5000 UNIT/ML IJ SOLN
5000.0000 [IU] | Freq: Three times a day (TID) | INTRAMUSCULAR | Status: DC
  Administered 2016-12-30 – 2017-01-01 (×8): 5000 [IU] via SUBCUTANEOUS
  Filled 2016-12-30 (×9): qty 1

## 2016-12-30 MED ORDER — ONDANSETRON HCL 4 MG/2ML IJ SOLN: 4.0000 mg | Freq: Three times a day (TID) | INTRAMUSCULAR | Status: DC | PRN

## 2016-12-30 MED ORDER — METHOCARBAMOL 500 MG PO TABS
500.0000 mg | ORAL_TABLET | Freq: Three times a day (TID) | ORAL | Status: DC | PRN
  Administered 2016-12-31: 500 mg via ORAL
  Filled 2016-12-30: qty 1

## 2016-12-30 MED ORDER — SODIUM CHLORIDE 0.9 % IV SOLN: 30.0000 meq | Freq: Once | INTRAVENOUS | Status: DC

## 2016-12-30 NOTE — ED Notes (Signed)
Attempted to call report, told that receiving nurse is looking over pt chart and that she would call ED back when she was ready for pt

## 2016-12-30 NOTE — Progress Notes (Signed)
Chaplain responded to consult for pt with advanced dementia in need of support.  Chaplain greets son and pt upon arrival.  Pt shares with chaplain, "I think I over did it yesterday and am just going to rest today."  Chaplain and pt's son normalize pt's feelings.  Chaplain educates pt's son on spiritual care services and informs him that we are available if they have any needs.  Son expresses appreciation for chaplain visit, shares they have no further needs at this time.  Please page chaplain if any spiritual care needs arise.  Blain PaisOvercash, Kymiah Araiza A, Chaplain 12/30/16 3:42 PM    12/30/16 1525  Clinical Encounter Type  Visited With Patient and family together  Visit Type Initial;Social support  Referral From Physician  Spiritual Encounters  Spiritual Needs Emotional  Stress Factors  Patient Stress Factors Exhausted;Health changes  Family Stress Factors None identified

## 2016-12-30 NOTE — Evaluation (Signed)
Physical Therapy Evaluation Patient Details Name: [name] MRN: [id number] DOB: 05-Nov-1921 Today's Date: 12/30/2016   History of Present Illness  83 year old female with a history of hypertension, dyslipidemia, osteoporosis, dementia, recent admission for influenza, resident of independent living, and admitted for pelvic fracture.    CT of pelvis showed: 1. Acute right parasymphyseal and symphysis fracture with minimal displacement and with extension into the symphysis pubis. 2. Additional tiny buckled right sacral alar fracture extending into the synovial portion of the right SI joint. 3. Lower lumbar degenerative facet arthropathy  Clinical Impression  Pt admitted with above diagnosis. Pt currently with functional limitations due to the deficits listed below (see PT Problem List).  Pt will benefit from skilled PT to increase their independence and safety with mobility to allow discharge to the venue listed below.  Pt assisted OOB to [facility] and then recliner.  Son reports pt has had significant decline in mobility since R hip pain started last week.  Plan is for SNF for rehab prior to return to ILF.      Follow Up Recommendations SNF;Supervision/Assistance - 24 hour    Equipment Recommendations  None recommended by PT    Recommendations for Other Services       Precautions / Restrictions Precautions Precautions: Fall Restrictions Other Position/Activity Restrictions: WBAT  (Dr. Malachi Bonds stated WBAT and placed order)     Mobility  Bed Mobility Overal bed mobility: Needs Assistance Bed Mobility: Supine to Sit     Supine to sit: Min guard     General bed mobility comments: verbal cues for safety  Transfers Overall transfer level: Needs assistance Equipment used: Rolling walker (2 wheeled) Transfers: Sit to/from [employer] Sit to Stand: Min assist;+2 safety/equipment Stand pivot transfers: Min assist;+2 safety/equipment       General transfer comment:  initial transfer to [facility] with bil HHA stand pivot due to urgency to urinate, performed with RW from [facility] over to recliner, assist to rise and steady   Ambulation/Gait                Stairs            Wheelchair Mobility    Modified Rankin (Stroke Patients Only)       Balance Overall balance assessment: Needs assistance         Standing balance support: Bilateral upper extremity supported;During functional activity Standing balance-Leahy Scale: Poor Standing balance comment: requires bil UE support                             Pertinent Vitals/Pain Pain Assessment: Faces Faces Pain Scale: Hurts a little bit Pain Location: not stated but holds mid-pelvis when stating pain Pain Intervention(s): Repositioned;Monitored during session    Home Living Family/patient expects to be discharged to:: Skilled nursing facility                 Additional Comments: from ILF    Prior Function Level of Independence: Needs assistance   Gait / Transfers Assistance Needed: ambulatory  ADL's / Homemaking Assistance Needed: son states he has hired caregiver come 3x/week to assist pt with bathing  Comments: was recently d/c from SNF back to ILF, son reports pt with increased pain since Thursday and decreased mobility, no witnessed fall     Hand Dominance        Extremity/Trunk Assessment        Lower Extremity Assessment Lower Extremity Assessment: Generalized weakness  Communication   Communication: HOH  Cognition Arousal/Alertness: Awake/alert Behavior During Therapy: Restless Overall Cognitive Status: History of cognitive impairments - at baseline                      General Comments      Exercises     Assessment/Plan    PT Assessment Patient needs continued PT services  PT Problem List Decreased mobility;Decreased activity tolerance;Decreased balance;Decreased knowledge of use of DME;Decreased cognition;Decreased safety  awareness;Pain       PT Treatment Interventions DME instruction;Gait training;Functional mobility training;Therapeutic exercise;Therapeutic activities;Patient/family education    PT Goals (Current goals can be found in the Care Plan section)  Acute Rehab PT Goals Patient Stated Goal: son reports pt loves walking PT Goal Formulation: With family Time For Goal Achievement: 01/06/17 Potential to Achieve Goals: Good    Frequency Min 3X/week   Barriers to discharge        Co-evaluation               End of Session Equipment Utilized During Treatment: Gait belt Activity Tolerance: Patient tolerated treatment well Patient left: with call bell/phone within reach;in chair;with chair alarm set;with family/visitor present   PT Visit Diagnosis: Difficulty in walking, not elsewhere classified (R26.2)         Time: [id number] PT Time Calculation (min) (ACUTE ONLY): 14 min   Charges:   PT Evaluation $PT Eval Low Complexity: 1 Procedure     PT G Codes:         Keni Elison,KATHrine E 12/30/2016, 2:10 PM Zenovia JarredKati Jammie Clink, PT, DPT 12/30/2016 Pager: [phone]

## 2016-12-30 NOTE — Clinical Social Work Note (Signed)
Clinical Social Work Assessment  Patient Details  Name: Martha BirksLouise F Seyfried MRN: [id number] Date of Birth: 05/11/1922  Date of referral:  12/30/16               Reason for consult:  Facility Placement                Permission sought to share information with:  Facility Industrial/product designerContact Representative Permission granted to share information::  Yes, Verbal Permission Granted  Name::        Agency::     Relationship::     Contact Information:     Housing/Transportation Living arrangements for the past 2 months:  Assisted Living Facility, Skilled Nursing Facility Source of Information:  Adult Children Patient Interpreter Needed:  None Criminal Activity/Legal Involvement Pertinent to Current Situation/Hospitalization:  No - Comment as needed Significant Relationships:  Adult Children Lives with:  Facility Resident Do you feel safe going back to the place where you live?  No Need for family participation in patient care:  Yes (Comment)  Care giving concerns:  CSW received consult for SNF placement.    Social Worker assessment / plan:  CSW familiar with patient/son as [facility] and [facility]. Per son, patient was just discharge [facility] a [facility]. [facility]. CSW completed FL[facility] and [facility] - [facility] [place] [facility] to see if they are able to offer a bed.   Employment status:  Retired Database administratornsurance information:  Managed Medicare PT Recommendations:  Skilled Holiday representativeursing Facility, 24 Hour Supervision Information / Referral to community resources:  Skilled Nursing Facility  Patient/Family's Response to care:    Patient/Family's Understanding of and Emotional Response to Diagnosis, Current Treatment, and Prognosis:  Patient's son seemed frustrated to be back in the hospital after just discharging back to her ALF from SNF a week ago.    Emotional Assessment Appearance:  Appears stated age Attitude/Demeanor/Rapport:    Affect (typically observed):    Orientation:  Oriented to Self Alcohol / Substance use:    Psych involvement (Current and /or in the community):     Discharge Needs  Concerns to be addressed:    Readmission within the last 30 days:    Current discharge risk:    Barriers to Discharge:      Arlyss RepressHarrison, Tyjanae Bartek F, LCSW 12/30/2016, 3:57 PM

## 2016-12-30 NOTE — Progress Notes (Signed)
PROGRESS NOTE  [name]  ZOX:[id number] DOB: April 26, 1922 DOA: 12/29/2016 PCP: Gaye Alken, MD  Brief Narrative:    [name] is a 83 y.o. female with medical history significant of dementia, hypertension, hyperlipidemia, osteopenia, arthritis, who presented with right hip pain for 4 days prior to admission.  No witnessed falls but patient found to have an acute right parasymphyseal and symphysis fracture with minimal displacement and with extension into the symphysis pubis as well as a tiny buckled right sacral alar fracture extending into the synovial portion of the right SI joint.  Case was discussed with orthopedic surgery who recommended weight bearing as tolerated, but no need for operation.  In addition to her fracture, she was found to have acute kidney injury and a possible UTI.  She has been admitted for IVF and will likely discharge to SNF when medically ready for additional PT/OT.    Assessment & Plan:   Principal Problem:   Pelvic fracture (HCC) Active Problems:   HLD (hyperlipidemia)   Essential hypertension   Hypokalemia   Dehydration   AKI (acute kidney injury) (HCC)   Protein-calorie malnutrition, moderate (HCC)  Pelvic fracture (HCC): As shown by CT scan of the pelvis.  Nonsurgical according to orthopedic surgery - schedule tylenol - trial of ultram for pain control - minimize sedating medications where able to reduce risk of delirium - When necessary Zofran for nausea - Robaxin for muscle spasm - CT-head and C spin without evidence of stroke, hemorrhage, or fracture - plan to discharge to SNF in 1-2 days for ongoing PT/OT  AKI: Likely due to prerenal secondary to dehydration and continuation of HCTZ.  Baseline creatinine of 0.6.  Creatinine trending down.   - continue IVF -  Repeat BMP in AM - Hold HCTZ  Leukocytosis: WBC 12.6. Likely due to stress-induced demargination but may also be due to UTI.  -  Trending down with IVF  Possible UTI  (many bacteria, elevated WBC, positive nitrites and trace LE -  Add on urine culture -  Hold on antibiotics pending culture  HLD, continue statin but question utility given age and dementia -  Defer to PCP  HTN, blood pressures low normal -decrease atenolol -continue to hold HCTZ  Hypokalemia: K=2.4 on admission, likely due to poor oral intake and HCTZ - Repleted  Protein-calorie malnutrition, moderate (HCC): -consult to Nutrition  DVT prophylaxis:  heparin Code Status:  DNR Family Communication:  Patient and her son Disposition Plan:  Pending improvement in kidney function, likely to SNF   Consultants:   Dr. Madelon Lips by phone, Orthopedic surgery  Procedures:  none  Antimicrobials:  Anti-infectives    None       Subjective: Limited by dementia.  Denies pain when resting.  Denies chest pain, shortness of breath, nausea, abdominal pain.  Reports pain when I ask her to lift up her legs.    Objective: Vitals:   12/30/16 0441 12/30/16 0619 12/30/16 1252 12/30/16 1426  BP: (!) 154/53 (!) 129/55 (!) 122/49 (!) 109/45  Pulse: 62 64 68 67  Resp: 20 16 15 16   Temp: 97.5 F (36.4 C) 97.9 F (36.6 C) 97.9 F (36.6 C) 97.6 F (36.4 C)  TempSrc: Axillary Axillary Oral Oral  SpO2: 100% 99% 97% 95%  Weight:      Height:        Intake/Output Summary (Last 24 hours) at 12/30/16 1437 Last data filed at 12/30/16 1427  Gross per 24 hour  Intake  879 ml  Output                0 ml  Net              879 ml   Filed Weights   12/29/16 1952 12/30/16 0155  Weight: 52.2 kg (115 lb) 52.2 kg (115 lb 1.3 oz)    Examination:  General exam:  Adult female.  No acute distress.  HEENT:  NCAT, MMM Respiratory system: Clear to auscultation bilaterally Cardiovascular system: Regular rate and rhythm, normal S1/S2. No murmurs, rubs, gallops or clicks.  Warm extremities Gastrointestinal system: Normal active bowel sounds, soft, nondistended, nontender. MSK:  Normal  tone and bulk, no lower extremity edema Neuro:  Unable to lift either leg off bed/hip flexion due to pain but able to bend and straighten both legs, flex and extent ankles.  Sensation intact bilateral lower extremities.      Data Reviewed: I have personally reviewed following labs and imaging studies  CBC:  Recent Labs Lab 12/29/16 2346 12/29/16 2355 12/30/16 0459  WBC 12.6*  --  11.4*  NEUTROABS 8.4*  --   --   HGB 12.8 13.3 11.9*  HCT 36.6 39.0 35.2*  MCV 86.3  --  88.4  PLT 146*  --  135*   Basic Metabolic Panel:  Recent Labs Lab 12/29/16 2346 12/29/16 2355 12/30/16 0459  NA 132* 134* 133*  K 2.5* 2.6* 3.8  CL 96* 94* 104  CO2 27  --  25  GLUCOSE 118* 122* 105*  BUN 37* 35* 32*  CREATININE 1.35* 1.30* 1.07*  CALCIUM 8.9  --  8.1*  MG  --   --  2.6*   GFR: Estimated Creatinine Clearance: 27.6 mL/min (by C-G formula based on SCr of 1.07 mg/dL (H)). Liver Function Tests: No results for input(s): AST, ALT, ALKPHOS, BILITOT, PROT, ALBUMIN in the last 168 hours. No results for input(s): LIPASE, AMYLASE in the last 168 hours. No results for input(s): AMMONIA in the last 168 hours. Coagulation Profile: No results for input(s): INR, PROTIME in the last 168 hours. Cardiac Enzymes: No results for input(s): CKTOTAL, CKMB, CKMBINDEX, TROPONINI in the last 168 hours. BNP (last 3 results) No results for input(s): PROBNP in the last 8760 hours. HbA1C: No results for input(s): HGBA1C in the last 72 hours. CBG: No results for input(s): GLUCAP in the last 168 hours. Lipid Profile: No results for input(s): CHOL, HDL, LDLCALC, TRIG, CHOLHDL, LDLDIRECT in the last 72 hours. Thyroid Function Tests: No results for input(s): TSH, T4TOTAL, FREET4, T3FREE, THYROIDAB in the last 72 hours. Anemia Panel: No results for input(s): VITAMINB12, FOLATE, FERRITIN, TIBC, IRON, RETICCTPCT in the last 72 hours. Urine analysis:    Component Value Date/Time   COLORURINE YELLOW 12/30/2016  0054   APPEARANCEUR HAZY (A) 12/30/2016 0054   LABSPEC 1.014 12/30/2016 0054   PHURINE 5.0 12/30/2016 0054   GLUCOSEU NEGATIVE 12/30/2016 0054   HGBUR SMALL (A) 12/30/2016 0054   BILIRUBINUR NEGATIVE 12/30/2016 0054   KETONESUR NEGATIVE 12/30/2016 0054   PROTEINUR NEGATIVE 12/30/2016 0054   UROBILINOGEN 0.2 10/06/2008 1458   NITRITE POSITIVE (A) 12/30/2016 0054   LEUKOCYTESUR TRACE (A) 12/30/2016 0054   Sepsis Labs: @LABRCNTIP (procalcitonin:4,lacticidven:4)  )No results found for this or any previous visit (from the past 240 hour(s)).    Radiology Studies: Ct Head Wo Contrast  Result Date: 12/30/2016 CLINICAL DATA:  Larey Seat last night. EXAM: CT HEAD WITHOUT CONTRAST CT CERVICAL SPINE WITHOUT CONTRAST TECHNIQUE: Multidetector CT imaging of  the head and cervical spine was performed following the standard protocol without intravenous contrast. Multiplanar CT image reconstructions of the cervical spine were also generated. COMPARISON:  11/28/2016 FINDINGS: CT HEAD FINDINGS Brain: There is no intracranial hemorrhage, mass or evidence of acute infarction. There is moderate generalized atrophy. There is moderate chronic microvascular ischemic change. There is no significant extra-axial fluid collection. No acute intracranial findings are evident. Vascular: No hyperdense vessel or unexpected calcification. Skull: Normal. Negative for fracture or focal lesion. Sinuses/Orbits: No acute finding. Other: None. CT CERVICAL SPINE FINDINGS Alignment: Normal. Skull base and vertebrae: No acute fracture. No primary bone lesion or focal pathologic process. Soft tissues and spinal canal: No prevertebral fluid or swelling. No visible canal hematoma. Disc levels: Moderate cervical degenerative disc disease at C6-7. Facet joints are arthritic, but intact. Mild degenerative spondylolisthesis at C5-6. Upper chest: Negative. Other: None IMPRESSION: 1. No acute intracranial findings. There is moderate generalized atrophy  and chronic appearing white matter hypodensities which likely represent small vessel ischemic disease. 2. Negative for acute cervical spine fracture Electronically Signed   By: Ellery Plunkaniel R Mitchell M.D.   On: 12/30/2016 04:33   Ct Cervical Spine Wo Contrast  Result Date: 12/30/2016 CLINICAL DATA:  Larey SeatFell last night. EXAM: CT HEAD WITHOUT CONTRAST CT CERVICAL SPINE WITHOUT CONTRAST TECHNIQUE: Multidetector CT imaging of the head and cervical spine was performed following the standard protocol without intravenous contrast. Multiplanar CT image reconstructions of the cervical spine were also generated. COMPARISON:  11/28/2016 FINDINGS: CT HEAD FINDINGS Brain: There is no intracranial hemorrhage, mass or evidence of acute infarction. There is moderate generalized atrophy. There is moderate chronic microvascular ischemic change. There is no significant extra-axial fluid collection. No acute intracranial findings are evident. Vascular: No hyperdense vessel or unexpected calcification. Skull: Normal. Negative for fracture or focal lesion. Sinuses/Orbits: No acute finding. Other: None. CT CERVICAL SPINE FINDINGS Alignment: Normal. Skull base and vertebrae: No acute fracture. No primary bone lesion or focal pathologic process. Soft tissues and spinal canal: No prevertebral fluid or swelling. No visible canal hematoma. Disc levels: Moderate cervical degenerative disc disease at C6-7. Facet joints are arthritic, but intact. Mild degenerative spondylolisthesis at C5-6. Upper chest: Negative. Other: None IMPRESSION: 1. No acute intracranial findings. There is moderate generalized atrophy and chronic appearing white matter hypodensities which likely represent small vessel ischemic disease. 2. Negative for acute cervical spine fracture Electronically Signed   By: Ellery Plunkaniel R Mitchell M.D.   On: 12/30/2016 04:33   Ct Pelvis Wo Contrast  Result Date: 12/29/2016 CLINICAL DATA:  Right hip pain after fall. EXAM: CT PELVIS WITHOUT  CONTRAST TECHNIQUE: Multidetector CT imaging of the pelvis was performed following the standard protocol without intravenous contrast. COMPARISON:  CXR 12/27/2016 FINDINGS: Urinary Tract:  No abnormality visualized. Bowel:  Unremarkable visualized pelvic bowel loops. Vascular/Lymphatic: Aorto bi-iliac atherosclerosis without aneurysm. Atherosclerosis of the internal iliac arteries bilaterally and right common femoral artery. No lymphadenopathy. Reproductive:  Uterus appears surgically absent.  No adnexal mass. Other:  None. Musculoskeletal: Minimal buckled appearing fracture of the right sacral ala along its lateral aspect adjacent to and extending into the synovial portion of the right SI joint. Acute closed with right parasymphysis and symphysis fracture extending into the pubic symphysis. One 0 mild osteoarthritis of the AC joints. Lower lumbar facet arthropathy is noted Schmorl's node along the superior endplate of L5. IMPRESSION: 1. Acute right parasymphyseal and symphysis fracture with minimal displacement and with extension into the symphysis pubis confirming findings of prior radiographs. 2.  Additional tiny buckled right sacral alar fracture extending into the synovial portion of the right SI joint. 3. Lower lumbar degenerative facet arthropathy. Electronically Signed   By: Tollie Eth M.D.   On: 12/29/2016 23:20     Scheduled Meds: . acetaminophen  650 mg Oral TID  . aspirin EC  81 mg Oral Daily  . atenolol  100 mg Oral Daily  . atorvastatin  40 mg Oral Daily  . cholecalciferol  2,000 Units Oral Daily  . feeding supplement (ENSURE ENLIVE)  237 mL Oral BID BM  . heparin  5,000 Units Subcutaneous Q8H   Continuous Infusions: . sodium chloride 100 mL/hr at 12/30/16 0055     LOS: 0 days    Time spent: 30 min    Renae Fickle, MD [facility] Pager [phone]  If 7PM-7AM, please contact night-coverage [email or web address] Password [facility] 12/30/2016, 2:37 PM

## 2016-12-30 NOTE — Clinical Social Work Placement (Signed)
   CLINICAL SOCIAL WORK PLACEMENT  NOTE  Date:  12/30/2016  Patient Details  Name: [name] MRN: [id number] Date of Birth: 11/24/1921  Clinical Social Work is seeking post-discharge placement for this patient at the Skilled  Nursing Facility level of care (*CSW will initial, date and re-position this form in  chart as items are completed):  Yes   Patient/family provided with [facility] Clinical Social Work Department's list of facilities offering this level of care within the geographic area requested by the patient (or if unable, by the patient's family).  Yes   Patient/family informed of their freedom to choose among providers that offer the needed level of care, that participate in Medicare, Medicaid or managed care program needed by the patient, have an available bed and are willing to accept the patient.  Yes   Patient/family informed of [facility]'s ownership interest in [facility] and [facility], as well as of the fact that they are under no obligation to receive care at these facilities.  PASRR submitted to EDS on       PASRR number received on       Existing PASRR number confirmed on 12/30/16     FL2 transmitted to all facilities in geographic area requested by pt/family on 12/30/16     FL2 transmitted to all facilities within larger geographic area on       Patient informed that his/her managed care company has contracts with or will negotiate with certain facilities, including the following:            Patient/family informed of bed offers received.  Patient chooses bed at       Physician recommends and patient chooses bed at      Patient to be transferred to   on  .  Patient to be transferred to facility by       Patient family notified on   of transfer.  Name of family member notified:        PHYSICIAN       Additional Comment:    _______________________________________________ [name], Martha Lama F, LCSW 12/30/2016, 4:04 PM

## 2016-12-30 NOTE — Progress Notes (Signed)
Patient arrived on the unit at approximately 0132 from the ED and accompanied by her son. Patient is alert to self and son. Complains of pain with movement. For CT of head and c-spine this AM.

## 2016-12-30 NOTE — ED Notes (Signed)
Pt has a potassium of 2.6 EDP Messner made aware.

## 2016-12-30 NOTE — ED Notes (Signed)
RN aware of critical K+

## 2016-12-30 NOTE — NC FL2 (Signed)
[place] MEDICAID FL2 LEVEL OF CARE SCREENING TOOL     IDENTIFICATION  Patient Name: [name] Birthdate: 14-Dec-1921 Sex: female Admission Date (Current Location): 12/29/2016  [facility] and [place] Number:  Producer, television/film/video and Address:  [facility],  [address], [place] [address]      Provider Number: [id number]  Attending Physician Name and Address:  Renae Fickle, MD  Relative Name and Phone Number:       Current Level of Care: Hospital Recommended Level of Care: Skilled Nursing Facility Prior Approval Number:    Date Approved/Denied:   PASRR Number: [id number]  Discharge Plan: SNF    Current Diagnoses: Patient Active Problem List   Diagnosis Date Noted  . Pelvic fracture (HCC) 12/30/2016  . Protein-calorie malnutrition, moderate (HCC) 12/30/2016  . Hypokalemia 11/28/2016  . Dehydration 11/28/2016  . AKI (acute kidney injury) (HCC)   . Confusion   . HLD (hyperlipidemia) 02/25/2009  . Essential hypertension 02/25/2009  . PRURITUS 02/25/2009  . FROZEN RIGHT SHOULDER 02/25/2009  . OSTEOPOROSIS 02/25/2009  . PATHOLOGIC FRACTURE OF DISTAL RADIUS AND ULNA 02/25/2009  . ABNORMALITY OF GAIT 02/25/2009  . UNSPECIFIED CLOSED FRACTURE OF ANKLE 02/25/2009    Orientation RESPIRATION BLADDER Height & Weight     Self, Time, Situation  Normal Continent Weight: 115 lb 1.3 oz (52.2 kg) Height:  5\' 3"  (160 cm)  BEHAVIORAL SYMPTOMS/MOOD NEUROLOGICAL BOWEL NUTRITION STATUS      Continent Diet (Regular)  AMBULATORY STATUS COMMUNICATION OF NEEDS Skin   Extensive Assist Verbally Normal                       Personal Care Assistance Level of Assistance  Bathing, Dressing Bathing Assistance: Limited assistance   Dressing Assistance: Limited assistance     Functional Limitations Info             SPECIAL CARE FACTORS FREQUENCY  PT (By licensed PT), OT (By licensed OT)     PT Frequency: 5 OT Frequency: 5             Contractures      Additional Factors Info  Code Status, Allergies Code Status Info: DNR Allergies Info: Ace Inhibitors, Amoxicillin           Current Medications (12/30/2016):  This is the current hospital active medication list Current Facility-Administered Medications  Medication Dose Route Frequency Provider Last Rate Last Dose  . 0.9 %  sodium chloride infusion   Intravenous Continuous Lorretta Harp, MD 100 mL/hr at 12/30/16 0055    . acetaminophen (TYLENOL) tablet 650 mg  650 mg Oral TID Renae Fickle, MD   650 mg at 12/30/16 1224  . aspirin EC tablet 81 mg  81 mg Oral Daily Lorretta Harp, MD   81 mg at 12/30/16 1223  . [START ON 12/31/2016] atenolol (TENORMIN) tablet 50 mg  50 mg Oral Daily Renae Fickle, MD      . atorvastatin (LIPITOR) tablet 40 mg  40 mg Oral Daily Lorretta Harp, MD   40 mg at 12/30/16 1223  . bisacodyl (DULCOLAX) EC tablet 5 mg  5 mg Oral Daily PRN Lorretta Harp, MD      . cholecalciferol (VITAMIN D) tablet 2,000 Units  2,000 Units Oral Daily Lorretta Harp, MD   2,000 Units at 12/30/16 1224  . feeding supplement (ENSURE ENLIVE) (ENSURE ENLIVE) liquid 237 mL  237 mL Oral BID BM Renae Fickle, MD   237 mL at 12/30/16 1444  .  heparin injection 5,000 Units  5,000 Units Subcutaneous Q8H Lorretta HarpXilin Niu, MD   5,000 Units at 12/30/16 1444  . hydrALAZINE (APRESOLINE) injection 5 mg  5 mg Intravenous Q2H PRN Lorretta HarpXilin Niu, MD      . methocarbamol (ROBAXIN) tablet 500 mg  500 mg Oral Q8H PRN Lorretta HarpXilin Niu, MD      . ondansetron [facility](ZOFRAN) injection 4 mg  4 mg Intravenous Q8H PRN Lorretta HarpXilin Niu, MD      . senna-docusate (Senokot-S) tablet 1 tablet  1 tablet Oral QHS PRN Lorretta HarpXilin Niu, MD      . traMADol Janean Sark(ULTRAM) tablet 50 mg  50 mg Oral Q6H PRN Renae FickleMackenzie Short, MD      . zolpidem (AMBIEN) tablet 5 mg  5 mg Oral QHS PRN Lorretta HarpXilin Niu, MD         Discharge Medications: Please see discharge summary for a list of discharge medications.  Relevant Imaging Results:  Relevant Lab Results:   Additional  Information SSN: [id number]  Arlyss RepressHarrison, Azavier Creson F, LCSW

## 2016-12-31 DIAGNOSIS — W19XXXD Unspecified fall, subsequent encounter: Secondary | ICD-10-CM

## 2016-12-31 LAB — BASIC METABOLIC PANEL
ANION GAP: 6 (ref 5–15)
BUN: 21 mg/dL — AB (ref 6–20)
CHLORIDE: 105 mmol/L (ref 101–111)
CO2: 23 mmol/L (ref 22–32)
Calcium: 8.1 mg/dL — ABNORMAL LOW (ref 8.9–10.3)
Creatinine, Ser: 0.74 mg/dL (ref 0.44–1.00)
GFR calc Af Amer: >60 mL/min (ref >60)
GFR calc non Af Amer: >60 mL/min (ref >60)
Glucose, Bld: 91 mg/dL (ref 65–99)
POTASSIUM: 3 mmol/L — AB (ref 3.5–5.1)
Sodium: 134 mmol/L — ABNORMAL LOW (ref 135–145)

## 2016-12-31 LAB — CBC
HEMATOCRIT: 32 % — AB (ref 36.0–46.0)
HEMOGLOBIN: 11 g/dL — AB (ref 12.0–15.0)
MCH: 30.4 pg (ref 26.0–34.0)
MCHC: 34.4 g/dL (ref 30.0–36.0)
MCV: 88.4 fL (ref 78.0–100.0)
Platelets: 134 10*3/uL — ABNORMAL LOW (ref 150–400)
RBC: 3.62 MIL/uL — ABNORMAL LOW (ref 3.87–5.11)
RDW: 15.3 % (ref 11.5–15.5)
WBC: 10.1 10*3/uL (ref 4.0–10.5)

## 2016-12-31 LAB — UREA NITROGEN, URINE: UREA NITROGEN UR: 548 mg/dL

## 2016-12-31 LAB — MAGNESIUM: Magnesium: 1.9 mg/dL (ref 1.7–2.4)

## 2016-12-31 MED ORDER — POTASSIUM CHLORIDE CRYS ER 20 MEQ PO TBCR
40.0000 meq | EXTENDED_RELEASE_TABLET | Freq: Once | ORAL | Status: AC
  Administered 2016-12-31: 40 meq via ORAL
  Filled 2016-12-31: qty 2

## 2016-12-31 MED ORDER — POLYETHYLENE GLYCOL 3350 17 G PO PACK: 17.0000 g | PACK | Freq: Every day | ORAL | 0 refills | Status: AC

## 2016-12-31 MED ORDER — TRAMADOL HCL 50 MG PO TABS: 50.0000 mg | ORAL_TABLET | Freq: Four times a day (QID) | ORAL | 0 refills | Status: AC | PRN

## 2016-12-31 MED ORDER — POLYETHYLENE GLYCOL 3350 17 G PO PACK
17.0000 g | PACK | Freq: Every day | ORAL | Status: DC
  Administered 2016-12-31 – 2017-01-01 (×2): 17 g via ORAL
  Filled 2016-12-31 (×2): qty 1

## 2016-12-31 MED ORDER — SENNA 8.6 MG PO TABS
2.0000 | ORAL_TABLET | Freq: Every day | ORAL | Status: DC
  Administered 2016-12-31: 17.2 mg via ORAL
  Filled 2016-12-31: qty 2

## 2016-12-31 MED ORDER — MAGNESIUM SULFATE IN D5W 1-5 GM/100ML-% IV SOLN
1.0000 g | Freq: Once | INTRAVENOUS | Status: AC
  Administered 2016-12-31: 1 g via INTRAVENOUS
  Filled 2016-12-31: qty 100

## 2016-12-31 MED ORDER — ATENOLOL 50 MG PO TABS: 50.0000 mg | ORAL_TABLET | Freq: Every day | ORAL | 0 refills | Status: AC

## 2016-12-31 MED ORDER — ENSURE ENLIVE PO LIQD: 237.0000 mL | Freq: Two times a day (BID) | ORAL | 0 refills | Status: DC

## 2016-12-31 MED ORDER — METHOCARBAMOL 500 MG PO TABS: 500.0000 mg | ORAL_TABLET | Freq: Three times a day (TID) | ORAL | 0 refills | Status: DC | PRN

## 2016-12-31 NOTE — Clinical Social Work Note (Addendum)
CSW presented bed offers to patient and her son, [facility] patient on Monday once insurance authorization has been approved, CSW updated physician and bedside nurse.  CSW informed patient's son that she may have a copay due to patient not having 60 days of wellness period since her last rehab stay.  Patient's son expressed understanding, CSW informed him that the facility can tell him how many days she has left before she has a copay.   CSW faxed required clinicals to [facility] to begin insurance authorization.  Unit CSW to follow up with patient's [facility], [facility], and insurance company.  Martha KnackEric R. Hamdi Kley, MSW, Theresia MajorsLCSWA [phone] 12/31/2016 1:42 PM

## 2016-12-31 NOTE — Discharge Instructions (Signed)
Simple Pelvic Fracture, Adult °A pelvic fracture is a break in one of the pelvic bones. The pelvic bones include the bones that you sit on and the bones that make up the lower part of your spine. A pelvic fracture is called simple if the broken bones are stable and are not moving out of place. °What are the causes? °Common causes of this type of fracture include: °· A fall. °· A car accident. °· Force or pressure applied to the pelvis. ° °What increases the risk? °You may be at higher risk for this type of fracture if: °· You play high-impact sports. °· You are an older person with a condition that causes weak bones (osteoporosis). °· You have a bone-weakening disease. ° °What are the signs or symptoms? °Signs and symptoms may include: °· Tenderness, swelling, or bruising in the affected area. °· Pain when moving the hip. °· Pain when walking or standing. ° °How is this diagnosed? °A diagnosis is made with a physical exam and X-rays. Sometimes, a CT scan is also done. °How is this treated? °The goal of treatment is to get the bones to heal in a good position. Treatment of a simple pelvic fracture usually involves staying in bed (bed rest) and using crutches or a walker until the bones heal. Medicines may be prescribed for pain. Medicines may also be prescribed that help to prevent blood clots from forming in the legs. °Follow these instructions at home: °Managing pain, stiffness, and swelling °· If directed, apply ice to the injured area: °? Put ice in a plastic bag. °? Place a towel between your skin and the bag. °? Leave the ice on for 20 minutes, 2-3 times a day. °· Raise the injured area above the level of your heart while you are sitting or lying down. °Driving °· Do not  drive or operate heavy machinery until your health care provider tells you it is safe to do. °Activity °· Stay on bed rest for as long as directed by your health care provider. °· While on bed rest: °? Change the position of your legs every  1-2 hours. This keeps blood moving well through both of your legs. °? You may sit for as long as you feel comfortable. °· After bed rest: °? Avoid strenuous activities for as long as directed by your health care provider. °? Return to your normal activities as directed by your health care provider. Ask your health care provider what activities are safe for you. °Safety °· Do not use the injured limb to support your body weight until your health care provider says that you can. Use crutches or a walker as directed by your health care provider. °General instructions °· Do not use any tobacco products, including cigarettes, chewing tobacco, or electronic cigarettes. Tobacco can delay bone healing. If you need help quitting, ask your health care provider. °· Take medicines only as directed by your health care provider. °· Keep all follow-up visits as directed by your health care provider. This is important. °Contact a health care provider if: °· Your pain gets worse. °· Your pain is not relieved with medicines. °Get help right away if: °· You feel light-headed or faint. °· You develop chest pain. °· You develop shortness of breath. °· You have a fever. °· You have blood in your urine or your stools. °· You have vaginal bleeding. °· You have difficulty or pain with urination or with a bowel movement. °· You have difficulty or increased pain   with walking. °· You have new or increased swelling in one of your legs. °· You have numbness in your legs or groin area. °This information is not intended to replace advice given to you by your health care provider. Make sure you discuss any questions you have with your health care provider. °Document Released: 12/18/2001 Document Revised: 06/04/2016 Document Reviewed: 06/02/2014 °Elsevier Interactive Patient Education © 2017 [employer] ° °

## 2016-12-31 NOTE — Evaluation (Signed)
Occupational Therapy Evaluation Patient Details Name: [name] MRN: [id number] DOB: 11/18/1921 Today's Date: 12/31/2016    History of Present Illness 83 year old female with a history of hypertension, dyslipidemia, osteoporosis, dementia, recent admission for influenza, resident of independent living, and admitted for pelvic fracture.    CT of pelvis showed: 1. Acute right parasymphyseal and symphysis fracture with minimal displacement and with extension into the symphysis pubis. 2. Additional tiny buckled right sacral alar fracture extending into the synovial portion of the right SI joint. 3. Lower lumbar degenerative facet arthropathy   Clinical Impression   Pt was admitted for the above.  She is able to participate in therapy and will benefit from continued OT in acute as well as SNF after discharge. Per chart, pt had assistance for bathing. Will follow in acute setting with goals to increase standing tolerance and ambulating to bathroom at min guard level.  Pt currently needs min A for SPT to toilet    Follow Up Recommendations  SNF    Equipment Recommendations  3 in 1 bedside commode    Recommendations for Other Services       Precautions / Restrictions Precautions Precautions: Fall Restrictions Other Position/Activity Restrictions: WBAT      Mobility Bed Mobility         Supine to sit: Min assist     General bed mobility comments: light assistance for LLE  Transfers   Equipment used: Rolling walker (2 wheeled)   Sit to Stand: Min assist Stand pivot transfers: Min assist       General transfer comment: light steadying assistance.  Pt pulled up on RW    Balance                                            ADL Overall ADL's : Needs assistance/impaired     Grooming: Wash/dry hands;Brushing hair;Set up;Sitting   Upper Body Bathing: Supervision/ safety;Sitting   Lower Body Bathing: Moderate assistance;Sit to/from stand   Upper Body  Dressing : Minimal assistance;Sitting   Lower Body Dressing: Maximal assistance;Sit to/from stand   Toilet Transfer: Minimal assistance;BSC;RW;Stand-pivot   Toileting- Clothing Manipulation and Hygiene: Supervision/safety;Sitting/lateral lean         General ADL Comments: pt used toilet and performed grooming. She held LLE to assist OOB and needed a little bit of assistance. She did not want to don socks (therapist did) nor brush teeth this am.  Pt very cooperative but just didn't want to do a couple of things.     Vision         Perception     Praxis      Pertinent Vitals/Pain Pain Assessment: Faces Faces Pain Scale: Hurts a little bit Pain Intervention(s): Limited activity within patient's tolerance;Monitored during session;Premedicated before session     Hand Dominance     Extremity/Trunk Assessment Upper Extremity Assessment Upper Extremity Assessment: Difficult to assess due to impaired cognition (appears wfls)           Communication Communication Communication: HOH   Cognition Arousal/Alertness: Awake/alert Behavior During Therapy: WFL for tasks assessed/performed Overall Cognitive Status: History of cognitive impairments - at baseline                     General Comments       Exercises       Shoulder Instructions  Home Living Family/patient expects to be discharged to:: Skilled nursing facility                                 Additional Comments: from ILF      Prior Functioning/Environment Level of Independence: Needs assistance  Gait / Transfers Assistance Needed: ambulatory ADL's / Homemaking Assistance Needed: son states he has hired caregiver come 3x/week to assist pt with bathing   Comments: was recently d/c from SNF back to ILF, son reports pt with increased pain since Thursday and decreased mobility, no witnessed fall        OT Problem List: Decreased strength;Decreased activity tolerance;Impaired balance  (sitting and/or standing);Decreased cognition;Decreased safety awareness;Decreased knowledge of use of DME or AE;Pain      OT Treatment/Interventions: Self-care/ADL training;DME and/or AE instruction;Patient/family education;Balance training;Therapeutic activities;Cognitive remediation/compensation    OT Goals(Current goals can be found in the care plan section) Acute Rehab OT Goals Patient Stated Goal: son reports pt loves walking OT Goal Formulation: Patient unable to participate in goal setting Time For Goal Achievement: 01/07/17 Potential to Achieve Goals: Good ADL Goals Pt Will Perform Grooming: (P) with min guard assist;standing Pt Will Transfer to Toilet: (P) with min guard assist;ambulating;bedside commode  OT Frequency: Min 2X/week   Barriers to D/C:            Co-evaluation              End of Session Nurse Communication:  (used toilet)  Activity Tolerance: Patient tolerated treatment well Patient left: in bed;with call bell/phone within reach;with bed alarm set  OT Visit Diagnosis: Pain;Unsteadiness on feet (R26.81) Pain - Right/Left: Left Pain - part of body: Leg                ADL either performed or assessed with clinical judgement  Time: 0952-1009 OT Time Calculation (min): 17 min Charges:  OT General Charges $OT Visit: 1 Procedure OT Evaluation $OT Eval Low Complexity: 1 Procedure G-Codes:     [place], OTR/L [phone] 12/31/2016  Lane Kjos 12/31/2016, 10:49 AM

## 2016-12-31 NOTE — Care Management Note (Signed)
Case Management Note  Patient Details  Name: [name] MRN: [id number] Date of Birth: 04/17/1922  Subjective/Objective:   Pelvic Fracture                 Action/Plan: Discharge Planning: Chart reviewed. PT recommending SNF. CSW following for SNF placement. Will continue to follow for dc needs.     Expected Discharge Date:                 Expected Discharge Plan:  Skilled Nursing Facility  In-House Referral:  Clinical Social Work  Discharge planning Services  CM Consult  Post Acute Care Choice:  NA Choice offered to:  NA  DME Arranged:  N/A DME Agency:  NA  HH Arranged:  NA HH Agency:  NA  Status of Service:  Completed, signed off  If discussed at Long Length of Stay Meetings, dates discussed:    Additional Comments:  Elliot CousinShavis, Jaquis Picklesimer Ellen, RN 12/31/2016, 10:52 AM

## 2016-12-31 NOTE — Discharge Summary (Signed)
Physician Discharge Summary  [name] ZOX:[id number] DOB: [birth year] DOA: 12/29/2016  PCP: Gaye Alken, MD  Admit date: 12/29/2016 Discharge date: 12/31/2016  Admitted From: ILF  Disposition:  SNF   Recommendations for Outpatient Follow-up:  1. Follow up with Orthopedic surgery, Dr. Farris Has, in 1 week:  Will need to reschedule 01/01/2017 if unable to arrange for transportation in time.   2. Please obtain BMP in 1 week to check creatinine and potassium 3. Follow up pending urine culture results 4. Weight bearing as tolerated bilateral lower extremities 5. Consider palliative care consult  Home Health:  Ongoing PT/OT  Equipment/Devices:  none  Discharge Condition:  Stable, improved CODE STATUS:  DNR  Diet recommendation:  Regular   Brief/Interim Summary:  Martha Giovanetti Rogersis a 83 y.o.femalewith medical history significant of dementia, hypertension, hyperlipidemia, osteopenia, arthritis, who presented with right hip pain for 4 days prior to admission.  No witnessed falls but patient found to have an acute right parasymphyseal and symphysis fracture with minimal displacement and with extension into the symphysis pubis as well as a tiny buckled right sacral alar fracture extending into the synovial portion of the right SI joint.  Case was discussed with orthopedic surgery Dr. Madelon Lips who recommended weight bearing as tolerated, but no need for operation.  In addition to her fracture, she was found to have acute kidney injury and a possible UTI.  She has been admitted for IVF and will likely discharge to SNF when medically ready for additional PT/OT.  No antibiotics were administered since patient denies dysuria and is clinically stable while we await urine culture results.    Discharge Diagnoses:  Principal Problem:   Pelvic fracture (HCC) Active Problems:   HLD (hyperlipidemia)   Essential hypertension   Hypokalemia   Dehydration   AKI (acute kidney injury) (HCC)    Protein-calorie malnutrition, moderate (HCC)  Pelvic fracture (HCC):As shown by CT scan of the pelvis.  Nonsurgical according to orthopedic surgery - scheduled tylenol - Ultram for pain control - Robaxin for muscle spasm - CT-head and C spin without evidence of stroke, hemorrhage, or fracture - plan to discharge to SNF for ongoing PT/OT - Weight bearing as tolerated - follow up with orthopedic surgery in 1 week  WJX:BJYNWG due to prerenal secondary to dehydration and continuation of HCTZ.  Baseline creatinine of 0.6-0.8.  Creatinine trended down from 1.35 to 0.74 with IVF.  -  Repeat BMP in 1 week -  Discontinued HCTZ  Leukocytosis: WBC 12.6.Likely due to stress-induced demargination but may also be due to UTI.  Resolved with IVF.  No antibiotics administered.    Possible UTI (many bacteria, elevated WBC, positive nitrites and trace LE), but patient asymptomatic -  f/u urine culture -  No antibiotics pending culture  HLD, continue statin but question utility given age and dementia -  Defer to PCP  HTN, blood pressures somewhat labile.  At times systolic BPs in the 150s, but also had blood pressures of 105/64 and 100/51.   - decreased atenolol - discontinued HCTZ  Hypokalemia: K=2.4on admission, likely due to poor oral intake and HCTZ - Recommend additional potassium supplementation  - repeat BMP in 1 week  Protein-calorie malnutrition, moderate(HCC): -consult to Nutrition -  Regular diet with supplements  Discharge Instructions  Discharge Instructions    Call MD for:  difficulty breathing, headache or visual disturbances    Complete by:  As directed    Call MD for:  extreme fatigue    Complete by:  As directed    Call MD for:  hives    Complete by:  As directed    Call MD for:  persistant dizziness or light-headedness    Complete by:  As directed    Call MD for:  persistant nausea and vomiting    Complete by:  As directed    Call MD for:  severe  uncontrolled pain    Complete by:  As directed    Call MD for:  temperature >100.4    Complete by:  As directed    Diet general    Complete by:  As directed    Increase activity slowly    Complete by:  As directed        Medication List    STOP taking these medications   hydrochlorothiazide 12.5 MG tablet Commonly known as:  HYDRODIURIL     TAKE these medications   aspirin EC 81 MG tablet Take 81 mg by mouth daily.   atenolol 50 MG tablet Commonly known as:  TENORMIN Take 1 tablet (50 mg total) by mouth daily. Start taking on:  01/01/2017 What changed:  medication strength  how much to take   atorvastatin 40 MG tablet Commonly known as:  LIPITOR Take 40 mg by mouth daily.   feeding supplement (ENSURE ENLIVE) Liqd Take 237 mLs by mouth 2 (two) times daily between meals.   methocarbamol 500 MG tablet Commonly known as:  ROBAXIN Take 1 tablet (500 mg total) by mouth every 8 (eight) hours as needed for muscle spasms.   polyethylene glycol packet Commonly known as:  MIRALAX / GLYCOLAX Take 17 g by mouth daily.   senna-docusate 8.6-50 MG tablet Commonly known as:  Senokot-S Take 1 tablet by mouth at bedtime as needed for mild constipation.   traMADol 50 MG tablet Commonly known as:  ULTRAM Take 1 tablet (50 mg total) by mouth every 6 (six) hours as needed for severe pain.   Vitamin D 2000 units tablet Take 2,000 Units by mouth daily.      Follow-up Information    Gaye Alken, MD. Schedule an appointment as soon as possible for a visit in 1 week(s).   Specialty:  Family Medicine Contact information: [address] [place] [address] [phone]        Milinda Antis., CRNA, CRNA. Schedule an appointment as soon as possible for a visit in 1 week(s).   Contact information: [address] [place] [address] [phone]          Allergies  Allergen Reactions  . Ace Inhibitors     Unknown reaction   .  Amoxicillin     Unknown reaction  Has patient had a PCN reaction causing immediate rash, facial/tongue/throat swelling, SOB or lightheadedness with hypotension: Unknown Has patient had a PCN reaction causing severe rash involving mucus membranes or skin necrosis: Unknown Has patient had a PCN reaction that required hospitalization: Unknown Has patient had a PCN reaction occurring within the last 10 years: Unknown If all of the above answers are "NO", then may proceed with Cephalosporin use.     Consultations:  none   Procedures/Studies: Ct Head Wo Contrast  Result Date: 12/30/2016 CLINICAL DATA:  Larey Seat last night. EXAM: CT HEAD WITHOUT CONTRAST CT CERVICAL SPINE WITHOUT CONTRAST TECHNIQUE: Multidetector CT imaging of the head and cervical spine was performed following the standard protocol without intravenous contrast. Multiplanar CT image reconstructions of the cervical spine were also generated. COMPARISON:  11/28/2016 FINDINGS: CT HEAD  FINDINGS Brain: There is no intracranial hemorrhage, mass or evidence of acute infarction. There is moderate generalized atrophy. There is moderate chronic microvascular ischemic change. There is no significant extra-axial fluid collection. No acute intracranial findings are evident. Vascular: No hyperdense vessel or unexpected calcification. Skull: Normal. Negative for fracture or focal lesion. Sinuses/Orbits: No acute finding. Other: None. CT CERVICAL SPINE FINDINGS Alignment: Normal. Skull base and vertebrae: No acute fracture. No primary bone lesion or focal pathologic process. Soft tissues and spinal canal: No prevertebral fluid or swelling. No visible canal hematoma. Disc levels: Moderate cervical degenerative disc disease at C6-7. Facet joints are arthritic, but intact. Mild degenerative spondylolisthesis at C5-6. Upper chest: Negative. Other: None IMPRESSION: 1. No acute intracranial findings. There is moderate generalized atrophy and chronic appearing  white matter hypodensities which likely represent small vessel ischemic disease. 2. Negative for acute cervical spine fracture Electronically Signed   By: Ellery Plunk M.D.   On: 12/30/2016 04:33   Ct Cervical Spine Wo Contrast  Result Date: 12/30/2016 CLINICAL DATA:  Larey Seat last night. EXAM: CT HEAD WITHOUT CONTRAST CT CERVICAL SPINE WITHOUT CONTRAST TECHNIQUE: Multidetector CT imaging of the head and cervical spine was performed following the standard protocol without intravenous contrast. Multiplanar CT image reconstructions of the cervical spine were also generated. COMPARISON:  11/28/2016 FINDINGS: CT HEAD FINDINGS Brain: There is no intracranial hemorrhage, mass or evidence of acute infarction. There is moderate generalized atrophy. There is moderate chronic microvascular ischemic change. There is no significant extra-axial fluid collection. No acute intracranial findings are evident. Vascular: No hyperdense vessel or unexpected calcification. Skull: Normal. Negative for fracture or focal lesion. Sinuses/Orbits: No acute finding. Other: None. CT CERVICAL SPINE FINDINGS Alignment: Normal. Skull base and vertebrae: No acute fracture. No primary bone lesion or focal pathologic process. Soft tissues and spinal canal: No prevertebral fluid or swelling. No visible canal hematoma. Disc levels: Moderate cervical degenerative disc disease at C6-7. Facet joints are arthritic, but intact. Mild degenerative spondylolisthesis at C5-6. Upper chest: Negative. Other: None IMPRESSION: 1. No acute intracranial findings. There is moderate generalized atrophy and chronic appearing white matter hypodensities which likely represent small vessel ischemic disease. 2. Negative for acute cervical spine fracture Electronically Signed   By: Ellery Plunk M.D.   On: 12/30/2016 04:33   Ct Pelvis Wo Contrast  Result Date: 12/29/2016 CLINICAL DATA:  Right hip pain after fall. EXAM: CT PELVIS WITHOUT CONTRAST TECHNIQUE:  Multidetector CT imaging of the pelvis was performed following the standard protocol without intravenous contrast. COMPARISON:  CXR 12/27/2016 FINDINGS: Urinary Tract:  No abnormality visualized. Bowel:  Unremarkable visualized pelvic bowel loops. Vascular/Lymphatic: Aorto bi-iliac atherosclerosis without aneurysm. Atherosclerosis of the internal iliac arteries bilaterally and right common femoral artery. No lymphadenopathy. Reproductive:  Uterus appears surgically absent.  No adnexal mass. Other:  None. Musculoskeletal: Minimal buckled appearing fracture of the right sacral ala along its lateral aspect adjacent to and extending into the synovial portion of the right SI joint. Acute closed with right parasymphysis and symphysis fracture extending into the pubic symphysis. One 0 mild osteoarthritis of the AC joints. Lower lumbar facet arthropathy is noted Schmorl's node along the superior endplate of L5. IMPRESSION: 1. Acute right parasymphyseal and symphysis fracture with minimal displacement and with extension into the symphysis pubis confirming findings of prior radiographs. 2. Additional tiny buckled right sacral alar fracture extending into the synovial portion of the right SI joint. 3. Lower lumbar degenerative facet arthropathy. Electronically Signed   By: Tollie Eth  M.D.   On: 12/29/2016 23:20   Dg Hip Unilat With Pelvis 2-3 Views Right  Result Date: 12/27/2016 CLINICAL DATA:  Right hip pain for 1 day EXAM: DG HIP (WITH OR WITHOUT PELVIS) 2-3V RIGHT COMPARISON:  None. FINDINGS: Mild SI joint arthritis. No fracture or malalignment of the proximal right femur is visualized. Possible fracture deformity of the right superior pubic ramus of uncertain acuity. Button artifact over the right femur on the lateral view. The pubic symphysis appears grossly intact. IMPRESSION: 1. No acute fracture or dislocation of the right hip 2. Possible subtle fracture deformity of the right superior pubic ramus of uncertain  acuity. Follow-up CT could be obtained as indicated. These results will be called to the ordering clinician or representative by the Radiologist Assistant, and communication documented in the PACS or zVision Dashboard. Electronically Signed   By: Jasmine PangKim  Fujinaga M.D.   On: 12/27/2016 22:13    Subjective: Confused.  Denies chest pain, nausea, shortness of breath, abdominal pain.  Discharge Exam: Vitals:   12/31/16 0105 12/31/16 0433  BP: (!) 132/53 (!) 155/64  Pulse: 62 63  Resp: 16 20  Temp: 98.4 F (36.9 C) 98.7 F (37.1 C)   Vitals:   12/30/16 1824 12/30/16 2040 12/31/16 0105 12/31/16 0433  BP: (!) 100/51 (!) 144/55 (!) 132/53 (!) 155/64  Pulse: 68 62 62 63  Resp: 17 20 16 20   Temp: 97.9 F (36.6 C) 98.1 F (36.7 C) 98.4 F (36.9 C) 98.7 F (37.1 C)  TempSrc: Oral Oral Oral Oral  SpO2: 96% 98% 98% 97%  Weight:      Height:        General exam:  Adult female.  No acute distress.  HEENT:  NCAT, MMM Respiratory system:  Clear to auscultation bilaterally Cardiovascular system:  Regular rate and rhythm, normal S1/S2. No murmurs, rubs, gallops or clicks.  Warm extremities Gastrointestinal system:  Normal active bowel sounds, soft, nondistended, nontender. MSK:  Normal tone and bulk, no lower extremity edema Neuro:  Better able to bend knees today but still painful.  Ankle flexion/extension 5/5   The results of significant diagnostics from this hospitalization (including imaging, microbiology, ancillary and laboratory) are listed below for reference.     Microbiology: Recent Results (from the past 240 hour(s))  Culture, Urine     Status: None (Preliminary result)   Collection Time: 12/30/16 12:23 AM  Result Value Ref Range Status   Specimen Description URINE, RANDOM  Final   Special Requests NONE  Final   Culture   Final    CULTURE REINCUBATED FOR BETTER GROWTH Performed at [facility], [address] [place], [place] [address]    Report Status PENDING   Incomplete     Labs: BNP (last 3 results) No results for input(s): BNP in the last 8760 hours. Basic Metabolic Panel:  Recent Labs Lab 12/29/16 2346 12/29/16 2355 12/30/16 0459 12/31/16 0421  NA 132* 134* 133* 134*  K 2.5* 2.6* 3.8 3.0*  CL 96* 94* 104 105  CO2 27  --  25 23  GLUCOSE 118* 122* 105* 91  BUN 37* 35* 32* 21*  CREATININE 1.35* 1.30* 1.07* 0.74  CALCIUM 8.9  --  8.1* 8.1*  MG  --   --  2.6* 1.9   Liver Function Tests: No results for input(s): AST, ALT, ALKPHOS, BILITOT, PROT, ALBUMIN in the last 168 hours. No results for input(s): LIPASE, AMYLASE in the last 168 hours. No results for input(s): AMMONIA in  the last 168 hours. CBC:  Recent Labs Lab 12/29/16 2346 12/29/16 2355 12/30/16 0459 12/31/16 0421  WBC 12.6*  --  11.4* 10.1  NEUTROABS 8.4*  --   --   --   HGB 12.8 13.3 11.9* 11.0*  HCT 36.6 39.0 35.2* 32.0*  MCV 86.3  --  88.4 88.4  PLT 146*  --  135* 134*   Urinalysis    Component Value Date/Time   COLORURINE YELLOW 12/30/2016 0054   APPEARANCEUR HAZY (A) 12/30/2016 0054   LABSPEC 1.014 12/30/2016 0054   PHURINE 5.0 12/30/2016 0054   GLUCOSEU NEGATIVE 12/30/2016 0054   HGBUR SMALL (A) 12/30/2016 0054   BILIRUBINUR NEGATIVE 12/30/2016 0054   KETONESUR NEGATIVE 12/30/2016 0054   PROTEINUR NEGATIVE 12/30/2016 0054   UROBILINOGEN 0.2 10/06/2008 1458   NITRITE POSITIVE (A) 12/30/2016 0054   LEUKOCYTESUR TRACE (A) 12/30/2016 0054   Sepsis Labs Invalid input(s): PROCALCITONIN,  WBC,  LACTICIDVEN   Time coordinating discharge: Over 30 minutes  SIGNED:   Renae Fickle, MD  [facility] 12/31/2016, 11:57 AM Pager   If 7PM-7AM, please contact night-coverage [email or web address] Password [email or web address]

## 2017-01-01 DIAGNOSIS — W19XXXA Unspecified fall, initial encounter: Secondary | ICD-10-CM

## 2017-01-01 DIAGNOSIS — N3000 Acute cystitis without hematuria: Secondary | ICD-10-CM

## 2017-01-01 DIAGNOSIS — N390 Urinary tract infection, site not specified: Secondary | ICD-10-CM

## 2017-01-01 LAB — BASIC METABOLIC PANEL
ANION GAP: 6 (ref 5–15)
BUN: 14 mg/dL (ref 6–20)
CHLORIDE: 103 mmol/L (ref 101–111)
CO2: 26 mmol/L (ref 22–32)
Calcium: 8.4 mg/dL — ABNORMAL LOW (ref 8.9–10.3)
Creatinine, Ser: 0.73 mg/dL (ref 0.44–1.00)
GFR calc Af Amer: >60 mL/min (ref >60)
GLUCOSE: 95 mg/dL (ref 65–99)
POTASSIUM: 3.3 mmol/L — AB (ref 3.5–5.1)
Sodium: 135 mmol/L (ref 135–145)

## 2017-01-01 LAB — CBC
HEMATOCRIT: 33.5 % — AB (ref 36.0–46.0)
HEMOGLOBIN: 11.4 g/dL — AB (ref 12.0–15.0)
MCH: 30.1 pg (ref 26.0–34.0)
MCHC: 34 g/dL (ref 30.0–36.0)
MCV: 88.4 fL (ref 78.0–100.0)
Platelets: 157 10*3/uL (ref 150–400)
RBC: 3.79 MIL/uL — ABNORMAL LOW (ref 3.87–5.11)
RDW: 15.3 % (ref 11.5–15.5)
WBC: 11.8 10*3/uL — ABNORMAL HIGH (ref 4.0–10.5)

## 2017-01-01 MED ORDER — DOXYCYCLINE HYCLATE 100 MG PO TABS
100.0000 mg | ORAL_TABLET | Freq: Two times a day (BID) | ORAL | Status: DC
  Administered 2017-01-01: 100 mg via ORAL
  Filled 2017-01-01 (×3): qty 1

## 2017-01-01 MED ORDER — POTASSIUM CHLORIDE CRYS ER 20 MEQ PO TBCR
40.0000 meq | EXTENDED_RELEASE_TABLET | Freq: Once | ORAL | Status: AC
  Administered 2017-01-01: 40 meq via ORAL
  Filled 2017-01-01: qty 2

## 2017-01-01 MED ORDER — DOXYCYCLINE HYCLATE 100 MG PO TABS: 100.0000 mg | ORAL_TABLET | Freq: Two times a day (BID) | ORAL | 0 refills | Status: DC

## 2017-01-01 NOTE — Progress Notes (Signed)
Physical Therapy Treatment Patient Details Name: [name] MRN: [id number] DOB: 04-May-1922 Today's Date: 01/01/2017    History of Present Illness 83 year old female with a history of hypertension, dyslipidemia, osteoporosis, dementia, recent admission for influenza, resident of independent living, and admitted for pelvic fracture.    CT of pelvis showed: 1. Acute right parasymphyseal and symphysis fracture with minimal displacement and with extension into the symphysis pubis. 2. Additional tiny buckled right sacral alar fracture extending into the synovial portion of the right SI joint. 3. Lower lumbar degenerative facet arthropathy    PT Comments    Assisted pt OOB tio BSC required + 2 assist.  Very unsteady.  Assisted with amn a limited distance due to pain/fatigue.  Poor balance and HIGH FALL RISK.  Pt will need ST Rehab at SNF.   Follow Up Recommendations  SNF     Equipment Recommendations       Recommendations for Other Services       Precautions / Restrictions Precautions Precautions: Fall Precaution Comments: HOH Restrictions Weight Bearing Restrictions: No Other Position/Activity Restrictions: WBAT    Mobility  Bed Mobility Overal bed mobility: Needs Assistance Bed Mobility: Supine to Sit     Supine to sit: Min assist     General bed mobility comments: light assistance for LLE and increased time to complete.  Sruggle with scooting to EOB due to pain.  Transfers Overall transfer level: Needs assistance Equipment used: Rolling walker (2 wheeled) Transfers: Sit to/from [employer] Sit to Stand: Min assist;Mod assist;+2 safety/equipment Stand pivot transfers: Min assist;Mod assist;+2 safety/equipment       General transfer comment: + 2 assist for safety and increased time due to pain with repeat function VC's to complete turns and rech back back prior to sit.   Ambulation/Gait Ambulation/Gait assistance: Mod assist;+2  safety/equipment;+2 physical assistance Ambulation Distance (Feet): 11 Feet Assistive device: Rolling walker (2 wheeled) Gait Pattern/deviations: Step-to pattern;Step-through pattern;Shuffle;Trunk flexed Gait velocity: decreased   General Gait Details: very unsteady gait with limited amb distance.  Required + 2 assist for safety esp with turns and backward gait to recliner.  HIGH FALL RISK   Stairs            Wheelchair Mobility    Modified Rankin (Stroke Patients Only)       Balance                                    Cognition Arousal/Alertness: Awake/alert Behavior During Therapy: WFL for tasks assessed/performed Overall Cognitive Status: History of cognitive impairments - at baseline                 General Comments: requires repeat functional VC's    Exercises      General Comments        Pertinent Vitals/Pain Pain Assessment: Faces Faces Pain Scale: Hurts even more Pain Descriptors / Indicators: Discomfort;Grimacing Pain Intervention(s): Monitored during session;Repositioned    Home Living                      Prior Function            PT Goals (current goals can now be found in the care plan section) Progress towards PT goals: Progressing toward goals    Frequency    Min 3X/week      PT Plan Current plan remains appropriate    Co-evaluation  End of Session Equipment Utilized During Treatment: Gait belt Activity Tolerance: Patient limited by pain Patient left: in chair;with call bell/phone within reach;with chair alarm set Nurse Communication: Mobility status PT Visit Diagnosis: Difficulty in walking, not elsewhere classified (R26.2)     Time: [id number] PT Time Calculation (min) (ACUTE ONLY): 10 min  Charges:  $Gait Training: 8-22 mins                    G Codes:       Felecia ShellingLori Jenine Krisher  PTA [facility]  Acute  Rehab Pager      [phone]

## 2017-01-01 NOTE — Discharge Summary (Addendum)
Physician Discharge Summary  [name] ZOX:[id number] DOB: [birth year] DOA: 12/29/2016  PCP: Gaye Alken, MD  Admit date: 12/29/2016 Discharge date: 01/01/2017  Admitted From: ILF  Disposition:  SNF   Recommendations for Outpatient Follow-up:  1. Follow up with Orthopedic surgery, Dr. Farris Has, in 1 week:  Will need to reschedule 01/01/2017 if unable to arrange for transportation in time.   2. Please obtain BMP in 1 week to check creatinine and potassium 3. Follow up pending urine culture results 4. Weight bearing as tolerated bilateral lower extremities 5. Consider palliative care consult 6. Continue empiric doxycycline through 01/06/2017, five day course for UTI 7. F/u urine culture results for sensitivities.  ADDENDUM ON 01/02/2017:  Amp resistant E. Coli and Klebsiella UTI  Home Health:  Ongoing PT/OT  Equipment/Devices:  none  Discharge Condition:  Stable, improved CODE STATUS:  DNR  Diet recommendation:  Regular   Brief/Interim Summary:  Everly Rubalcava Rogersis a 83 y.o.femalewith medical history significant of dementia, hypertension, hyperlipidemia, osteopenia, arthritis, who presented with right hip pain for 4 days prior to admission.  No witnessed falls but patient found to have an acute right parasymphyseal and symphysis fracture with minimal displacement and with extension into the symphysis pubis as well as a tiny buckled right sacral alar fracture extending into the synovial portion of the right SI joint.  Case was discussed with orthopedic surgery Dr. Madelon Lips who recommended weight bearing as tolerated, but no need for operation.  In addition to her fracture, she was found to have acute kidney injury and UTI.  She was admitted for IVF and was started on antibiotics and pain medication.  She has had some improvement in her mobility since admission, but still needs two person assist for attempts at ambulation.  Pleasantly confused.    Discharge Diagnoses:  Principal  Problem:   Pelvic fracture (HCC) Active Problems:   HLD (hyperlipidemia)   Essential hypertension   Hypokalemia   Dehydration   AKI (acute kidney injury) (HCC)   Protein-calorie malnutrition, moderate (HCC)   Fall   UTI (urinary tract infection)  Pelvic fracture (HCC):As shown by CT scan of the pelvis.  Nonsurgical according to orthopedic surgery - Scheduled tylenol - Ultram for pain control - Robaxin for muscle spasm - CT-head and C spin without evidence of stroke, hemorrhage, or fracture - plan to discharge to SNF for ongoing PT/OT - Weight bearing as tolerated - follow up with orthopedic surgery in 1 week  WJX:BJYNWG due to prerenal secondary to dehydration and continuation of HCTZ.  Baseline creatinine of 0.6-0.8.  Creatinine trended down from 1.35 to 0.74 with IVF.  -  Repeat BMP in 1 week -  Discontinued HCTZ  Leukocytosis: WBC 12.6.Likely due to stress-induced demargination and UTI.  Resolved with IVF.  No antibiotics administered.    UTI (many bacteria, elevated WBC, positive nitrites and trace LE), -  f/u urine culture sensitivities -  start doxycycline on 3/12  HLD, continue statin but question utility given age and dementia -  Defer to PCP  HTN, blood pressures somewhat labile.  At times systolic BPs in the 150s, but also had blood pressures of 105/64 and 100/51.   - decreased atenolol - discontinued HCTZ  Hypokalemia: K=2.4on admission, likely due to poor oral intake and HCTZ - Recommend additional potassium supplementation  - repeat BMP in 1 week  Protein-calorie malnutrition, moderate(HCC): - appreciate Nutrition assistance -  Regular diet with supplements  Discharge Instructions  Discharge Instructions    Call MD  for:  difficulty breathing, headache or visual disturbances    Complete by:  As directed    Call MD for:  extreme fatigue    Complete by:  As directed    Call MD for:  hives    Complete by:  As directed    Call MD for:   persistant dizziness or light-headedness    Complete by:  As directed    Call MD for:  persistant nausea and vomiting    Complete by:  As directed    Call MD for:  severe uncontrolled pain    Complete by:  As directed    Call MD for:  temperature >100.4    Complete by:  As directed    Diet general    Complete by:  As directed    Increase activity slowly    Complete by:  As directed        Medication List    STOP taking these medications   hydrochlorothiazide 12.5 MG tablet Commonly known as:  HYDRODIURIL     TAKE these medications   aspirin EC 81 MG tablet Take 81 mg by mouth daily.   atenolol 50 MG tablet Commonly known as:  TENORMIN Take 1 tablet (50 mg total) by mouth daily. What changed:  medication strength  how much to take   atorvastatin 40 MG tablet Commonly known as:  LIPITOR Take 40 mg by mouth daily.   doxycycline 100 MG tablet Commonly known as:  VIBRA-TABS Take 1 tablet (100 mg total) by mouth every 12 (twelve) hours.   feeding supplement (ENSURE ENLIVE) Liqd Take 237 mLs by mouth 2 (two) times daily between meals.   methocarbamol 500 MG tablet Commonly known as:  ROBAXIN Take 1 tablet (500 mg total) by mouth every 8 (eight) hours as needed for muscle spasms.   polyethylene glycol packet Commonly known as:  MIRALAX / GLYCOLAX Take 17 g by mouth daily.   senna-docusate 8.6-50 MG tablet Commonly known as:  Senokot-S Take 1 tablet by mouth at bedtime as needed for mild constipation.   traMADol 50 MG tablet Commonly known as:  ULTRAM Take 1 tablet (50 mg total) by mouth every 6 (six) hours as needed for severe pain.   Vitamin D 2000 units tablet Take 2,000 Units by mouth daily.       Contact information for follow-up providers    Gaye AlkenBARNES,ELIZABETH STEWART, MD. Schedule an appointment as soon as possible for a visit in 1 week(s).   Specialty:  Family Medicine Contact information: [address] [place]  [id number] [phone]        Milinda AntisGeorge K Caffrey Jr., CRNA, CRNA. Schedule an appointment as soon as possible for a visit in 1 week(s).   Contact information: [address]/[place] [phone]            Contact information for after-discharge care    Destination    [facility] Follow up.   Specialty:  Skilled Nursing Facility Contact information: [address] [place] [id number] [phone]                 Allergies  Allergen Reactions  . Ace Inhibitors     Unknown reaction   . Amoxicillin     Unknown reaction  Has patient had a PCN reaction causing immediate rash, facial/tongue/throat swelling, SOB or lightheadedness with hypotension: Unknown Has patient had a PCN reaction causing severe rash involving mucus membranes or skin necrosis: Unknown Has patient  had a PCN reaction that required hospitalization: Unknown Has patient had a PCN reaction occurring within the last 10 years: Unknown If all of the above answers are "NO", then may proceed with Cephalosporin use.     Consultations:  none   Procedures/Studies: Ct Head Wo Contrast  Result Date: 12/30/2016 CLINICAL DATA:  Larey Seat last night. EXAM: CT HEAD WITHOUT CONTRAST CT CERVICAL SPINE WITHOUT CONTRAST TECHNIQUE: Multidetector CT imaging of the head and cervical spine was performed following the standard protocol without intravenous contrast. Multiplanar CT image reconstructions of the cervical spine were also generated. COMPARISON:  11/28/2016 FINDINGS: CT HEAD FINDINGS Brain: There is no intracranial hemorrhage, mass or evidence of acute infarction. There is moderate generalized atrophy. There is moderate chronic microvascular ischemic change. There is no significant extra-axial fluid collection. No acute intracranial findings are evident. Vascular: No hyperdense vessel or unexpected calcification. Skull: Normal. Negative for fracture or focal lesion. Sinuses/Orbits:  No acute finding. Other: None. CT CERVICAL SPINE FINDINGS Alignment: Normal. Skull base and vertebrae: No acute fracture. No primary bone lesion or focal pathologic process. Soft tissues and spinal canal: No prevertebral fluid or swelling. No visible canal hematoma. Disc levels: Moderate cervical degenerative disc disease at C6-7. Facet joints are arthritic, but intact. Mild degenerative spondylolisthesis at C5-6. Upper chest: Negative. Other: None IMPRESSION: 1. No acute intracranial findings. There is moderate generalized atrophy and chronic appearing white matter hypodensities which likely represent small vessel ischemic disease. 2. Negative for acute cervical spine fracture Electronically Signed   By: Ellery Plunk M.D.   On: 12/30/2016 04:33   Ct Cervical Spine Wo Contrast  Result Date: 12/30/2016 CLINICAL DATA:  Larey Seat last night. EXAM: CT HEAD WITHOUT CONTRAST CT CERVICAL SPINE WITHOUT CONTRAST TECHNIQUE: Multidetector CT imaging of the head and cervical spine was performed following the standard protocol without intravenous contrast. Multiplanar CT image reconstructions of the cervical spine were also generated. COMPARISON:  11/28/2016 FINDINGS: CT HEAD FINDINGS Brain: There is no intracranial hemorrhage, mass or evidence of acute infarction. There is moderate generalized atrophy. There is moderate chronic microvascular ischemic change. There is no significant extra-axial fluid collection. No acute intracranial findings are evident. Vascular: No hyperdense vessel or unexpected calcification. Skull: Normal. Negative for fracture or focal lesion. Sinuses/Orbits: No acute finding. Other: None. CT CERVICAL SPINE FINDINGS Alignment: Normal. Skull base and vertebrae: No acute fracture. No primary bone lesion or focal pathologic process. Soft tissues and spinal canal: No prevertebral fluid or swelling. No visible canal hematoma. Disc levels: Moderate cervical degenerative disc disease at C6-7. Facet joints  are arthritic, but intact. Mild degenerative spondylolisthesis at C5-6. Upper chest: Negative. Other: None IMPRESSION: 1. No acute intracranial findings. There is moderate generalized atrophy and chronic appearing white matter hypodensities which likely represent small vessel ischemic disease. 2. Negative for acute cervical spine fracture Electronically Signed   By: Ellery Plunk M.D.   On: 12/30/2016 04:33   Ct Pelvis Wo Contrast  Result Date: 12/29/2016 CLINICAL DATA:  Right hip pain after fall. EXAM: CT PELVIS WITHOUT CONTRAST TECHNIQUE: Multidetector CT imaging of the pelvis was performed following the standard protocol without intravenous contrast. COMPARISON:  CXR 12/27/2016 FINDINGS: Urinary Tract:  No abnormality visualized. Bowel:  Unremarkable visualized pelvic bowel loops. Vascular/Lymphatic: Aorto bi-iliac atherosclerosis without aneurysm. Atherosclerosis of the internal iliac arteries bilaterally and right common femoral artery. No lymphadenopathy. Reproductive:  Uterus appears surgically absent.  No adnexal mass. Other:  None. Musculoskeletal: Minimal buckled appearing fracture of the right sacral ala along its  lateral aspect adjacent to and extending into the synovial portion of the right SI joint. Acute closed with right parasymphysis and symphysis fracture extending into the pubic symphysis. One 0 mild osteoarthritis of the AC joints. Lower lumbar facet arthropathy is noted Schmorl's node along the superior endplate of L5. IMPRESSION: 1. Acute right parasymphyseal and symphysis fracture with minimal displacement and with extension into the symphysis pubis confirming findings of prior radiographs. 2. Additional tiny buckled right sacral alar fracture extending into the synovial portion of the right SI joint. 3. Lower lumbar degenerative facet arthropathy. Electronically Signed   By: Tollie Eth M.D.   On: 12/29/2016 23:20   Dg Hip Unilat With Pelvis 2-3 Views Right  Result Date:  12/27/2016 CLINICAL DATA:  Right hip pain for 1 day EXAM: DG HIP (WITH OR WITHOUT PELVIS) 2-3V RIGHT COMPARISON:  None. FINDINGS: Mild SI joint arthritis. No fracture or malalignment of the proximal right femur is visualized. Possible fracture deformity of the right superior pubic ramus of uncertain acuity. Button artifact over the right femur on the lateral view. The pubic symphysis appears grossly intact. IMPRESSION: 1. No acute fracture or dislocation of the right hip 2. Possible subtle fracture deformity of the right superior pubic ramus of uncertain acuity. Follow-up CT could be obtained as indicated. These results will be called to the ordering clinician or representative by the Radiologist Assistant, and communication documented in the PACS or zVision Dashboard. Electronically Signed   By: Jasmine Pang M.D.   On: 12/27/2016 22:13    Subjective: Denies chest pain, nausea, shortness of breath, abdominal pain.  Pleasantly confused  Discharge Exam: Vitals:   12/31/16 2209 01/01/17 0533  BP: 136/61 (!) 164/70  Pulse: 62 66  Resp: 16 16  Temp: 97.7 F (36.5 C) 97.3 F (36.3 C)   Vitals:   12/31/16 1436 12/31/16 1838 12/31/16 2209 01/01/17 0533  BP: 132/62 (!) 160/62 136/61 (!) 164/70  Pulse: 63 64 62 66  Resp: 18 16 16 16   Temp: 97.9 F (36.6 C) 97.5 F (36.4 C) 97.7 F (36.5 C) 97.3 F (36.3 C)  TempSrc: Oral Oral Oral Oral  SpO2: 98% 100% 99% 100%  Weight:      Height:        General exam:  Adult female.  No acute distress.  Smiling.  Son at bedside HEENT:  NCAT, MMM Respiratory system:  Clear to auscultation bilaterally Cardiovascular system:  Regular rate and rhythm, normal S1/S2. No murmurs, rubs, gallops or clicks.  Warm extremities Gastrointestinal system:  Normal active bowel sounds, soft, nondistended, nontender. MSK:  Normal tone and bulk, no lower extremity edema Neuro:  Able to bend knees and now lift each leg off bed about 6-8 inches today with less pain today.   Ankle flexion/extension 5/5   The results of significant diagnostics from this hospitalization (including imaging, microbiology, ancillary and laboratory) are listed below for reference.     Microbiology: Recent Results (from the past 240 hour(s))  Culture, Urine     Status: Abnormal (Preliminary result)   Collection Time: 12/30/16 12:23 AM  Result Value Ref Range Status   Specimen Description URINE, RANDOM  Final   Special Requests NONE  Final   Culture >=100,000 COLONIES/mL GRAM NEGATIVE RODS (A)  Final   Report Status PENDING  Incomplete     Labs: BNP (last 3 results) No results for input(s): BNP in the last 8760 hours. Basic Metabolic Panel:  Recent Labs Lab 12/29/16 2346 12/29/16 2355 12/30/16 0459  12/31/16 0421 01/01/17 0415  NA 132* 134* 133* 134* 135  K 2.5* 2.6* 3.8 3.0* 3.3*  CL 96* 94* 104 105 103  CO2 27  --  25 23 26   GLUCOSE 118* 122* 105* 91 95  BUN 37* 35* 32* 21* 14  CREATININE 1.35* 1.30* 1.07* 0.74 0.73  CALCIUM 8.9  --  8.1* 8.1* 8.4*  MG  --   --  2.6* 1.9  --    Liver Function Tests: No results for input(s): AST, ALT, ALKPHOS, BILITOT, PROT, ALBUMIN in the last 168 hours. No results for input(s): LIPASE, AMYLASE in the last 168 hours. No results for input(s): AMMONIA in the last 168 hours. CBC:  Recent Labs Lab 12/29/16 2346 12/29/16 2355 12/30/16 0459 12/31/16 0421 01/01/17 0415  WBC 12.6*  --  11.4* 10.1 11.8*  NEUTROABS 8.4*  --   --   --   --   HGB 12.8 13.3 11.9* 11.0* 11.4*  HCT 36.6 39.0 35.2* 32.0* 33.5*  MCV 86.3  --  88.4 88.4 88.4  PLT 146*  --  135* 134* 157   Urinalysis    Component Value Date/Time   COLORURINE YELLOW 12/30/2016 0054   APPEARANCEUR HAZY (A) 12/30/2016 0054   LABSPEC 1.014 12/30/2016 0054   PHURINE 5.0 12/30/2016 0054   GLUCOSEU NEGATIVE 12/30/2016 0054   HGBUR SMALL (A) 12/30/2016 0054   BILIRUBINUR NEGATIVE 12/30/2016 0054   KETONESUR NEGATIVE 12/30/2016 0054   PROTEINUR NEGATIVE 12/30/2016 0054    UROBILINOGEN 0.2 10/06/2008 1458   NITRITE POSITIVE (A) 12/30/2016 0054   LEUKOCYTESUR TRACE (A) 12/30/2016 0054   Sepsis Labs Invalid input(s): PROCALCITONIN,  WBC,  LACTICIDVEN   Time coordinating discharge: Over 30 minutes  SIGNED:   Renae Fickle, MD  [facility] 01/01/2017, 12:10 PM Pager   If 7PM-7AM, please contact night-coverage [email or web address] Password [email or web address]

## 2017-01-01 NOTE — Progress Notes (Signed)
CSW assisting with d/c planning. [facility]. Authorization is pending. Pt / son have been updated. Expecting dc to SNF today once authorization has been received.  Cori RazorJamie Ashe Graybeal LCSW [phone]

## 2017-01-01 NOTE — Clinical Social Work Placement (Signed)
   CLINICAL SOCIAL WORK PLACEMENT  NOTE  Date:  01/01/2017  Patient Details  Name: [name] MRN: [id number] Date of Birth: 11/18/1921  Clinical Social Work is seeking post-discharge placement for this patient at the Skilled  Nursing Facility level of care (*CSW will initial, date and re-position this form in  chart as items are completed):  Yes   Patient/family provided with [facility] Clinical Social Work Department's list of facilities offering this level of care within the geographic area requested by the patient (or if unable, by the patient's family).  Yes   Patient/family informed of their freedom to choose among providers that offer the needed level of care, that participate in Medicare, Medicaid or managed care program needed by the patient, have an available bed and are willing to accept the patient.  Yes   Patient/family informed of [facility]'s ownership interest in [facility] and [facility], as well as of the fact that they are under no obligation to receive care at these facilities.  PASRR submitted to EDS on       PASRR number received on       Existing PASRR number confirmed on 12/30/16     FL2 transmitted to all facilities in geographic area requested by pt/family on 12/30/16     FL2 transmitted to all facilities within larger geographic area on       Patient informed that his/her managed care company has contracts with or will negotiate with certain facilities, including the following:        Yes   Patient/family informed of bed offers received.  [facility]     Physician recommends and patient chooses bed at      [facility].  Patient to be transferred to facility by [facility]     Patient family notified on 01/01/17 of transfer.  Name of family member notified:  SON     PHYSICIAN       Additional Comment: Pt / son are in agreement with [facility]. [facility]  transport is required. Son is aware out of pocket costs could be associated with [facility] transport. [employer] medicare has provided authorization for SNF. D/C Summary has been sent to SNF for review. Scripts included in d/c packet. # for report provided to nsg.   _______________________________________________ Royetta AsalHaidinger, Jennifer Holland Lee, LCSW  [phone] 01/01/2017, 3:40 PM

## 2017-01-02 ENCOUNTER — Encounter: Payer: Self-pay | Admitting: Internal Medicine

## 2017-01-02 ENCOUNTER — Non-Acute Institutional Stay (SKILLED_NURSING_FACILITY): Payer: Medicare Other | Admitting: Internal Medicine

## 2017-01-02 DIAGNOSIS — E876 Hypokalemia: Secondary | ICD-10-CM

## 2017-01-02 DIAGNOSIS — I10 Essential (primary) hypertension: Secondary | ICD-10-CM

## 2017-01-02 DIAGNOSIS — N179 Acute kidney failure, unspecified: Secondary | ICD-10-CM

## 2017-01-02 DIAGNOSIS — S32509S Unspecified fracture of unspecified pubis, sequela: Secondary | ICD-10-CM

## 2017-01-02 DIAGNOSIS — E785 Hyperlipidemia, unspecified: Secondary | ICD-10-CM

## 2017-01-02 DIAGNOSIS — D72829 Elevated white blood cell count, unspecified: Secondary | ICD-10-CM

## 2017-01-02 DIAGNOSIS — E440 Moderate protein-calorie malnutrition: Secondary | ICD-10-CM | POA: Diagnosis not present

## 2017-01-02 DIAGNOSIS — R2681 Unsteadiness on feet: Secondary | ICD-10-CM

## 2017-01-02 DIAGNOSIS — N3000 Acute cystitis without hematuria: Secondary | ICD-10-CM | POA: Diagnosis not present

## 2017-01-02 DIAGNOSIS — D649 Anemia, unspecified: Secondary | ICD-10-CM | POA: Diagnosis not present

## 2017-01-02 DIAGNOSIS — K5900 Constipation, unspecified: Secondary | ICD-10-CM

## 2017-01-02 DIAGNOSIS — R4189 Other symptoms and signs involving cognitive functions and awareness: Secondary | ICD-10-CM | POA: Diagnosis not present

## 2017-01-02 LAB — URINE CULTURE: Culture: >=100000 — AB

## 2017-01-02 NOTE — Progress Notes (Signed)
LOCATION: [facility]  PCP: Gaye Alken, MD   Code Status: DNR  Goals of care: Advanced Directive information Advanced Directives 01/02/2017  Does Patient Have a Medical Advance Directive? Yes  Type of Advance Directive Out of facility DNR (pink MOST or yellow form)  Does patient want to make changes to medical advance directive? No - Patient declined  Copy of Healthcare Power of Attorney in Chart? -       Extended Emergency Contact Information Primary Emergency Contact: Chen[place], [place] Address: [address]          [place] Home Phone: [phone] Relation: Son   Allergies  Allergen Reactions  . Ace Inhibitors     Unknown reaction   . Amoxicillin     Unknown reaction  Has patient had a PCN reaction causing immediate rash, facial/tongue/throat swelling, SOB or lightheadedness with hypotension: Unknown Has patient had a PCN reaction causing severe rash involving mucus membranes or skin necrosis: Unknown Has patient had a PCN reaction that required hospitalization: Unknown Has patient had a PCN reaction occurring within the last 10 years: Unknown If all of the above answers are "NO", then may proceed with Cephalosporin use.     Chief Complaint  Patient presents with  . New Admit To SNF    New Admission Visit      HPI:  Patient is a 83 y.o. female seen today for short term rehabilitation post hospital admission from 12/29/16-01/01/17 with right hip pain and was found to have acute right parasymphyseal and symphysis fracture with minimal displacement and extension into symphysis pubis, tiny buckled right sacral alar fracture extending into synovial portion of right sacroiliac joint. Orthopedic was consulted and recommended conservative management with WBAT. She was treated for AKI and UTI with iv fluids and antibiotics. She is seen in her room today. She has medical history of dementia, HTN, HLD, arthritis  Review of  Systems:  Constitutional: Negative for fever, chills HENT: Negative for headache, congestion, nasal discharge Eyes: Negative for double vision and discharge. Wears glasses. Respiratory: Negative for cough, shortness of breath  Cardiovascular: Negative for chest pain, palpitations Gastrointestinal: Negative for heartburn, nausea, vomiting, abdominal pain. Last bowel movement was yesterday. Genitourinary: Negative for dysuria.  Musculoskeletal: Negative for back pain, fall and pain.  Skin: Negative for itching, rash.  Neurological: Negative for dizziness.    Past Medical History:  Diagnosis Date  . Arthritis   . Hyperlipemia   . Hypertension   . Osteopenia   . Vitamin D deficiency    Past Surgical History:  Procedure Laterality Date  . SHOULDER SURGERY     Right   Social History:   reports that she has never smoked. She has never used smokeless tobacco. She reports that she does not drink alcohol or use drugs.  No family history on file.  Medications: Allergies as of 01/02/2017      Reactions   Ace Inhibitors    Unknown reaction    Amoxicillin    Unknown reaction  Has patient had a PCN reaction causing immediate rash, facial/tongue/throat swelling, SOB or lightheadedness with hypotension: Unknown Has patient had a PCN reaction causing severe rash involving mucus membranes or skin necrosis: Unknown Has patient had a PCN reaction that required hospitalization: Unknown Has patient had a PCN reaction occurring within the last 10 years: Unknown If all of the above answers are "NO", then may proceed with Cephalosporin use.      Medication List  Accurate as of 01/02/17  1:50 PM. Always use your most recent med list.          aspirin EC 81 MG tablet Take 81 mg by mouth daily.   atenolol 50 MG tablet Commonly known as:  TENORMIN Take 1 tablet (50 mg total) by mouth daily.   atorvastatin 40 MG tablet Commonly known as:  LIPITOR Take 40 mg by mouth daily.     doxycycline 100 MG tablet Commonly known as:  VIBRA-TABS Take 1 tablet (100 mg total) by mouth every 12 (twelve) hours.   methocarbamol 500 MG tablet Commonly known as:  ROBAXIN Take 1 tablet (500 mg total) by mouth every 8 (eight) hours as needed for muscle spasms.   polyethylene glycol packet Commonly known as:  MIRALAX / GLYCOLAX Take 17 g by mouth daily.   senna-docusate 8.6-50 MG tablet Commonly known as:  Senokot-S Take 1 tablet by mouth at bedtime as needed for mild constipation.   traMADol 50 MG tablet Commonly known as:  ULTRAM Take 1 tablet (50 mg total) by mouth every 6 (six) hours as needed for severe pain.   UNABLE TO FIND Med Name: Med pass 237 mL by mouth 2 times daily between meals   Vitamin D 2000 units tablet Take 2,000 Units by mouth daily.       Immunizations: Immunization History  Administered Date(s) Administered  . Influenza-Unspecified 05/23/2016  . PPD Test 12/14/2016  . Pneumococcal-Unspecified 02/28/1989     Physical Exam: Vitals:   01/02/17 1347  BP: (!) 176/74  Pulse: 67  Resp: 18  Temp: 97.6 F (36.4 C)  TempSrc: Oral  SpO2: 97%  Weight: 115 lb (52.2 kg)  Height: 5\' 3"  (1.6 m)   Body mass index is 20.37 kg/m.  General- elderly female, thin built, in no acute distress Head- normocephalic, atraumatic, hard of hearing Nose-  no nasal discharge Throat- moist mucus membrane, missing dentition Eyes- PERRLA, EOMI, no pallor, no icterus, no discharge Neck- no cervical lymphadenopathy Cardiovascular- normal s1,s2, no murmur Respiratory- bilateral clear to auscultation, no wheeze, no rhonchi, no crackles, no use of accessory muscles Abdomen- bowel sounds present, soft, non tender Musculoskeletal- able to move all 4 extremities, generalized weakness, needs assist with transfers Neurological- alert and oriented to self Skin- warm and dry, bruise to both hands and lower abdominal wall Psychiatry- normal mood and affect    Labs  reviewed: Basic Metabolic Panel:  Recent Labs  16/07/9601/06/18 1733  12/30/16 0459 12/31/16 0421 01/01/17 0415  NA  --   < > 133* 134* 135  K  --   < > 3.8 3.0* 3.3*  CL  --   < > 104 105 103  CO2  --   < > 25 23 26   GLUCOSE  --   < > 105* 91 95  BUN  --   < > 32* 21* 14  CREATININE  --   < > 1.07* 0.74 0.73  CALCIUM  --   < > 8.1* 8.1* 8.4*  MG 2.5*  --  2.6* 1.9  --   < > = values in this interval not displayed. Liver Function Tests:  Recent Labs  11/28/16 1201 11/29/16 0521  AST 73* 55*  ALT 32 26  ALKPHOS 51 41  BILITOT 1.4* 1.1  PROT 8.1 6.4*  ALBUMIN 3.7 2.9*   No results for input(s): LIPASE, AMYLASE in the last 8760 hours. No results for input(s): AMMONIA in the last 8760 hours. CBC:  Recent Labs  12/29/16 2346  12/30/16 0459 12/31/16 0421 01/01/17 0415  WBC 12.6*  --  11.4* 10.1 11.8*  NEUTROABS 8.4*  --   --   --   --   HGB 12.8  < > 11.9* 11.0* 11.4*  HCT 36.6  < > 35.2* 32.0* 33.5*  MCV 86.3  --  88.4 88.4 88.4  PLT 146*  --  135* 134* 157  < > = values in this interval not displayed. Cardiac Enzymes: No results for input(s): CKTOTAL, CKMB, CKMBINDEX, TROPONINI in the last 8760 hours. BNP: Invalid input(s): POCBNP CBG:  Recent Labs  11/28/16 1117 11/29/16 2114  GLUCAP 151* 101*    Radiological Exams: Ct Head Wo Contrast  Result Date: 12/30/2016 CLINICAL DATA:  Larey Seat last night. EXAM: CT HEAD WITHOUT CONTRAST CT CERVICAL SPINE WITHOUT CONTRAST TECHNIQUE: Multidetector CT imaging of the head and cervical spine was performed following the standard protocol without intravenous contrast. Multiplanar CT image reconstructions of the cervical spine were also generated. COMPARISON:  11/28/2016 FINDINGS: CT HEAD FINDINGS Brain: There is no intracranial hemorrhage, mass or evidence of acute infarction. There is moderate generalized atrophy. There is moderate chronic microvascular ischemic change. There is no significant extra-axial fluid collection. No acute  intracranial findings are evident. Vascular: No hyperdense vessel or unexpected calcification. Skull: Normal. Negative for fracture or focal lesion. Sinuses/Orbits: No acute finding. Other: None. CT CERVICAL SPINE FINDINGS Alignment: Normal. Skull base and vertebrae: No acute fracture. No primary bone lesion or focal pathologic process. Soft tissues and spinal canal: No prevertebral fluid or swelling. No visible canal hematoma. Disc levels: Moderate cervical degenerative disc disease at C6-7. Facet joints are arthritic, but intact. Mild degenerative spondylolisthesis at C5-6. Upper chest: Negative. Other: None IMPRESSION: 1. No acute intracranial findings. There is moderate generalized atrophy and chronic appearing white matter hypodensities which likely represent small vessel ischemic disease. 2. Negative for acute cervical spine fracture Electronically Signed   By: Ellery Plunk M.D.   On: 12/30/2016 04:33   Ct Cervical Spine Wo Contrast  Result Date: 12/30/2016 CLINICAL DATA:  Larey Seat last night. EXAM: CT HEAD WITHOUT CONTRAST CT CERVICAL SPINE WITHOUT CONTRAST TECHNIQUE: Multidetector CT imaging of the head and cervical spine was performed following the standard protocol without intravenous contrast. Multiplanar CT image reconstructions of the cervical spine were also generated. COMPARISON:  11/28/2016 FINDINGS: CT HEAD FINDINGS Brain: There is no intracranial hemorrhage, mass or evidence of acute infarction. There is moderate generalized atrophy. There is moderate chronic microvascular ischemic change. There is no significant extra-axial fluid collection. No acute intracranial findings are evident. Vascular: No hyperdense vessel or unexpected calcification. Skull: Normal. Negative for fracture or focal lesion. Sinuses/Orbits: No acute finding. Other: None. CT CERVICAL SPINE FINDINGS Alignment: Normal. Skull base and vertebrae: No acute fracture. No primary bone lesion or focal pathologic process. Soft  tissues and spinal canal: No prevertebral fluid or swelling. No visible canal hematoma. Disc levels: Moderate cervical degenerative disc disease at C6-7. Facet joints are arthritic, but intact. Mild degenerative spondylolisthesis at C5-6. Upper chest: Negative. Other: None IMPRESSION: 1. No acute intracranial findings. There is moderate generalized atrophy and chronic appearing white matter hypodensities which likely represent small vessel ischemic disease. 2. Negative for acute cervical spine fracture Electronically Signed   By: Ellery Plunk M.D.   On: 12/30/2016 04:33   Ct Pelvis Wo Contrast  Result Date: 12/29/2016 CLINICAL DATA:  Right hip pain after fall. EXAM: CT PELVIS WITHOUT CONTRAST TECHNIQUE: Multidetector CT imaging of the pelvis was  performed following the standard protocol without intravenous contrast. COMPARISON:  CXR 12/27/2016 FINDINGS: Urinary Tract:  No abnormality visualized. Bowel:  Unremarkable visualized pelvic bowel loops. Vascular/Lymphatic: Aorto bi-iliac atherosclerosis without aneurysm. Atherosclerosis of the internal iliac arteries bilaterally and right common femoral artery. No lymphadenopathy. Reproductive:  Uterus appears surgically absent.  No adnexal mass. Other:  None. Musculoskeletal: Minimal buckled appearing fracture of the right sacral ala along its lateral aspect adjacent to and extending into the synovial portion of the right SI joint. Acute closed with right parasymphysis and symphysis fracture extending into the pubic symphysis. One 0 mild osteoarthritis of the AC joints. Lower lumbar facet arthropathy is noted Schmorl's node along the superior endplate of L5. IMPRESSION: 1. Acute right parasymphyseal and symphysis fracture with minimal displacement and with extension into the symphysis pubis confirming findings of prior radiographs. 2. Additional tiny buckled right sacral alar fracture extending into the synovial portion of the right SI joint. 3. Lower lumbar  degenerative facet arthropathy. Electronically Signed   By: Tollie Eth M.D.   On: 12/29/2016 23:20   Dg Hip Unilat With Pelvis 2-3 Views Right  Result Date: 12/27/2016 CLINICAL DATA:  Right hip pain for 1 day EXAM: DG HIP (WITH OR WITHOUT PELVIS) 2-3V RIGHT COMPARISON:  None. FINDINGS: Mild SI joint arthritis. No fracture or malalignment of the proximal right femur is visualized. Possible fracture deformity of the right superior pubic ramus of uncertain acuity. Button artifact over the right femur on the lateral view. The pubic symphysis appears grossly intact. IMPRESSION: 1. No acute fracture or dislocation of the right hip 2. Possible subtle fracture deformity of the right superior pubic ramus of uncertain acuity. Follow-up CT could be obtained as indicated. These results will be called to the ordering clinician or representative by the Radiologist Assistant, and communication documented in the PACS or zVision Dashboard. Electronically Signed   By: Jasmine Pang M.D.   On: 12/27/2016 22:13    Assessment/Plan  Unsteady gait With symphysis and parasymphyseal fracture. Will have patient work with PT/OT as tolerated to regain strength and restore function.  Fall precautions are in place.  Pelvic fracture nonsurgical management for now. To f/u with orthopedic. PMR consult for pain management and WBAT. Continue tramadol 50 mg q6h prn pain and robaxin 500 mg q8h prn spasm. Continue vitamin d supplement.  UTI Continue and complete course of doxycycline 100 mg bid on 01/06/17. Maintain hydration  Leukocytosis Currently being treated for UTI. Monitor wbc curve.   AKI s/p iv fluid, check bmp  Cognitive impairment Aspiration preautions and SLP consult  Hypokalemia Check bmp  Anemia unspecified Check cbc  Protein calorie malnutrition Monitor po intake. Monitor weight. Continue medpass supplement.   HTN Continue atenolol 50 mg daily, monitor BP  HLD On atorvastatin, no changes  made  Constipation On miralax daily with senna on need basis. Monitor.    Goals of care: short term rehabilitation   Labs/tests ordered: cbc, cmp 01/08/17  Family/ staff Communication: reviewed care plan with patient and nursing supervisor    Oneal Grout, MD Internal Medicine [facility] [address] [place], [place] [address] Cell Phone (Monday-Friday 8 am - 5 pm): [phone] On Call: [phone] and follow prompts after 5 pm and on weekends Office Phone: [phone] Office Fax: [phone]

## 2017-01-09 LAB — BASIC METABOLIC PANEL
BUN: 23 mg/dL — AB (ref 4–21)
CREATININE: 0.6 mg/dL (ref 0.5–1.1)
Glucose: 96 mg/dL
Potassium: 2.9 mmol/L — AB (ref 3.4–5.3)
SODIUM: 138 mmol/L (ref 137–147)

## 2017-01-10 LAB — BASIC METABOLIC PANEL
BUN: 26 mg/dL — AB (ref 4–21)
Creatinine: 0.8 mg/dL (ref 0.5–1.1)
Glucose: 99 mg/dL
POTASSIUM: 5.5 mmol/L — AB (ref 3.4–5.3)
SODIUM: 145 mmol/L (ref 137–147)

## 2017-01-12 LAB — BASIC METABOLIC PANEL
BUN: 26 mg/dL — AB (ref 4–21)
Creatinine: 0.7 mg/dL (ref 0.5–1.1)
GLUCOSE: 72 mg/dL
Potassium: 4.2 mmol/L (ref 3.4–5.3)
Sodium: 139 mmol/L (ref 137–147)

## 2017-01-23 ENCOUNTER — Encounter: Payer: Self-pay | Admitting: Adult Health

## 2017-01-23 ENCOUNTER — Non-Acute Institutional Stay (SKILLED_NURSING_FACILITY): Payer: Medicare Other | Admitting: Adult Health

## 2017-01-23 DIAGNOSIS — D72829 Elevated white blood cell count, unspecified: Secondary | ICD-10-CM | POA: Diagnosis not present

## 2017-01-23 DIAGNOSIS — S32509S Unspecified fracture of unspecified pubis, sequela: Secondary | ICD-10-CM | POA: Diagnosis not present

## 2017-01-23 DIAGNOSIS — E785 Hyperlipidemia, unspecified: Secondary | ICD-10-CM | POA: Diagnosis not present

## 2017-01-23 DIAGNOSIS — R2681 Unsteadiness on feet: Secondary | ICD-10-CM

## 2017-01-23 DIAGNOSIS — E559 Vitamin D deficiency, unspecified: Secondary | ICD-10-CM

## 2017-01-23 DIAGNOSIS — D649 Anemia, unspecified: Secondary | ICD-10-CM | POA: Diagnosis not present

## 2017-01-23 DIAGNOSIS — K5900 Constipation, unspecified: Secondary | ICD-10-CM

## 2017-01-23 DIAGNOSIS — I10 Essential (primary) hypertension: Secondary | ICD-10-CM | POA: Diagnosis not present

## 2017-01-23 NOTE — Progress Notes (Signed)
DATE:  01/23/2017   MRN:  [id number]  BIRTHDAY: 30-Apr-1922  Facility:  Nursing Home Location:  [facility]  Nursing Home Room Number: 1108-P  LEVEL OF CARE:  SNF (31)  Contact Information    Name Relation Home Work Mobile   Unice, [place] [phone]     No name specified           Code Status History    Date Active Date Inactive Code Status Order ID Comments User Context   12/30/2016 12:31 AM 01/01/2017  8:16 PM DNR [id number]  Lorretta Harp, MD ED   11/28/2016  4:51 PM 11/30/2016  7:15 PM Full Code [id number]  Richarda Overlie, MD ED    Questions for Most Recent Historical Code Status (Order [id number])    Question Answer Comment   In the event of cardiac or respiratory ARREST Do not call a "code blue"    In the event of cardiac or respiratory ARREST Do not perform Intubation, CPR, defibrillation or ACLS    In the event of cardiac or respiratory ARREST Use medication by any route, position, wound care, and other measures to relive pain and suffering. May use oxygen, suction and manual treatment of airway obstruction as needed for comfort.         Advance Directive Documentation     Most Recent Value  Type of Advance Directive  Out of facility DNR (pink MOST or yellow form)  Pre-existing out of facility DNR order (yellow form or pink MOST form)  -  "MOST" Form in Place?  -       Chief Complaint  Patient presents with  . Discharge Note    HISTORY OF PRESENT ILLNESS:  This is a 89-YO female seen for a discharge visit.  She will discharge on 01/24/2017 with home health PT, OT, ST, CNA, and Nursing services.    She has been admitted to [facility] on 01/01/17 from [facility] admission dates 12/29/16 thru 01/01/17 with right hip pain and was found to have acute right parasymphyseal and symphysis pubis, tiny buckled right sacral alar fracture extending into synovial portion of right sacroiliac joint. Orthopedic was consulted and recommended  conservative management with WBAT. She was treated with AKI and UTI with IV fluids and antibiotics. She has PMH of dementia, hypertension, hyperlipidemia and arthritis.  Patient was admitted to this facility for short-term rehabilitation after the patient's recent hospitalization.  Patient has completed SNF rehabilitation and therapy has cleared the patient for discharge.    PAST MEDICAL HISTORY:  Past Medical History:  Diagnosis Date  . Arthritis   . Hyperlipemia   . Hypertension   . Osteopenia   . Vitamin D deficiency      CURRENT MEDICATIONS: Reviewed  Patient's Medications  New Prescriptions   No medications on file  Previous Medications   ACETAMINOPHEN (TYLENOL) 325 MG TABLET    Take 650 mg by mouth 3 (three) times daily. 0600, 1400, 2200   ASPIRIN EC 81 MG TABLET    Take 81 mg by mouth daily.   ATENOLOL (TENORMIN) 50 MG TABLET    Take 1 tablet (50 mg total) by mouth daily.   ATORVASTATIN (LIPITOR) 40 MG TABLET    Take 40 mg by mouth daily.   CHOLECALCIFEROL (VITAMIN D) 2000 UNITS TABLET    Take 2,000 Units by mouth daily.   NUTRITIONAL SUPPLEMENTS (NUTRITIONAL SUPPLEMENT PO)    Take 1 each by mouth daily. Magic Cup  POLYETHYLENE GLYCOL (MIRALAX / GLYCOLAX) PACKET    Take 17 g by mouth daily.   SENNA-DOCUSATE (SENOKOT-S) 8.6-50 MG TABLET    Take 1 tablet by mouth at bedtime as needed for mild constipation.   TRAMADOL (ULTRAM) 50 MG TABLET    Take 1 tablet (50 mg total) by mouth every 6 (six) hours as needed for severe pain.   UNABLE TO FIND    Med Name: Med pass 237 mL by mouth 2 times daily between meals  Modified Medications   No medications on file  Discontinued Medications   DOXYCYCLINE (VIBRA-TABS) 100 MG TABLET    Take 1 tablet (100 mg total) by mouth every 12 (twelve) hours.   METHOCARBAMOL (ROBAXIN) 500 MG TABLET    Take 1 tablet (500 mg total) by mouth every 8 (eight) hours as needed for muscle spasms.     Allergies  Allergen Reactions  . Ace Inhibitors      Unknown reaction   . Amoxicillin     Unknown reaction  Has patient had a PCN reaction causing immediate rash, facial/tongue/throat swelling, SOB or lightheadedness with hypotension: Unknown Has patient had a PCN reaction causing severe rash involving mucus membranes or skin necrosis: Unknown Has patient had a PCN reaction that required hospitalization: Unknown Has patient had a PCN reaction occurring within the last 10 years: Unknown If all of the above answers are "NO", then may proceed with Cephalosporin use.      REVIEW OF SYSTEMS:  GENERAL: no change in appetite, no fatigue, no weight changes, no fever, chills or weakness EYES: Denies change in vision, dry eyes, eye pain, itching or discharge EARS: Denies  ringing in ears, or earache NOSE: Denies nasal congestion or epistaxis MOUTH and THROAT: Denies oral discomfort, gingival pain or bleeding, pain from teeth or hoarseness   RESPIRATORY: no cough, SOB, DOE, wheezing, hemoptysis CARDIAC: no chest pain, edema or palpitations GI: no abdominal pain, diarrhea, constipation, heart burn, nausea or vomiting GU: Denies dysuria, frequency, hematuria, incontinence, or discharge PSYCHIATRIC: Denies feeling of depression or anxiety. No report of hallucinations, insomnia, paranoia, or agitation     PHYSICAL EXAMINATION  GENERAL APPEARANCE: Well nourished. In no acute distress. Normal body habitus SKIN:  Skin is warm and dry.  HEAD: Normal in size and contour. No evidence of trauma EYES: Lids open and close normally. No blepharitis, entropion or ectropion. PERRL. Conjunctivae are clear and sclerae are white. Lenses are without opacity EARS: Pinnae are normal. Hard of hearing MOUTH and THROAT: Lips are without lesions. Oral mucosa is moist and without lesions. Tongue is normal in shape, size, and color and without lesions NECK: supple, trachea midline, no neck masses, no thyroid tenderness, no thyromegaly LYMPHATICS: no LAN in the neck, no  supraclavicular LAN RESPIRATORY: breathing is even & unlabored, BS CTAB CARDIAC: RRR, no murmur,no extra heart sounds, no edema GI: abdomen soft, normal BS, no masses, no tenderness, no hepatomegaly, no splenomegaly EXTREMITIES:  Able to move X 4 extremities, BLE with generalized weakness PSYCHIATRIC: Alert to self, disoriented to time and place. Affect and behavior are appropriate   LABS/RADIOLOGY: Labs reviewed: Basic Metabolic Panel:  Recent Labs  53/66/44 1733  12/30/16 0459 12/31/16 0421 01/01/17 0415 01/09/17 01/10/17 01/12/17  NA  --   < > 133* 134* 135 138 145 139  K  --   < > 3.8 3.0* 3.3* 2.9* 5.5* 4.2  CL  --   < > 104 105 103  --   --   --  CO2  --   < > --   --   --   GLUCOSE  --   < > 105* 91 95  --   --   --   BUN  --   < > 32* 21* 14 23* 26* 26*  CREATININE  --   < > 1.07* 0.74 0.73 0.6 0.8 0.7  CALCIUM  --   < > 8.1* 8.1* 8.4*  --   --   --   MG 2.5*  --  2.6* 1.9  --   --   --   --   < > = values in this interval not displayed. Liver Function Tests:  Recent Labs  11/28/16 1201 11/29/16 0521  AST 73* 55*  ALT 32 26  ALKPHOS 51 41  BILITOT 1.4* 1.1  PROT 8.1 6.4*  ALBUMIN 3.7 2.9*   CBC:  Recent Labs  12/29/16 2346  12/30/16 0459 12/31/16 0421 01/01/17 0415  WBC 12.6*  --  11.4* 10.1 11.8*  NEUTROABS 8.4*  --   --   --   --   HGB 12.8  < > 11.9* 11.0* 11.4*  HCT 36.6  < > 35.2* 32.0* 33.5*  MCV 86.3  --  88.4 88.4 88.4  PLT 146*  --  135* 134* 157  < > = values in this interval not displayed. CBG:  Recent Labs  11/28/16 1117 11/29/16 2114  GLUCAP 151* 101*      Ct Head Wo Contrast  Result Date: 12/30/2016 CLINICAL DATA:  Larey Seat last night. EXAM: CT HEAD WITHOUT CONTRAST CT CERVICAL SPINE WITHOUT CONTRAST TECHNIQUE: Multidetector CT imaging of the head and cervical spine was performed following the standard protocol without intravenous contrast. Multiplanar CT image reconstructions of the cervical spine were also generated.  COMPARISON:  11/28/2016 FINDINGS: CT HEAD FINDINGS Brain: There is no intracranial hemorrhage, mass or evidence of acute infarction. There is moderate generalized atrophy. There is moderate chronic microvascular ischemic change. There is no significant extra-axial fluid collection. No acute intracranial findings are evident. Vascular: No hyperdense vessel or unexpected calcification. Skull: Normal. Negative for fracture or focal lesion. Sinuses/Orbits: No acute finding. Other: None. CT CERVICAL SPINE FINDINGS Alignment: Normal. Skull base and vertebrae: No acute fracture. No primary bone lesion or focal pathologic process. Soft tissues and spinal canal: No prevertebral fluid or swelling. No visible canal hematoma. Disc levels: Moderate cervical degenerative disc disease at C6-7. Facet joints are arthritic, but intact. Mild degenerative spondylolisthesis at C5-6. Upper chest: Negative. Other: None IMPRESSION: 1. No acute intracranial findings. There is moderate generalized atrophy and chronic appearing white matter hypodensities which likely represent small vessel ischemic disease. 2. Negative for acute cervical spine fracture Electronically Signed   By: Ellery Plunk M.D.   On: 12/30/2016 04:33   Ct Cervical Spine Wo Contrast  Result Date: 12/30/2016 CLINICAL DATA:  Larey Seat last night. EXAM: CT HEAD WITHOUT CONTRAST CT CERVICAL SPINE WITHOUT CONTRAST TECHNIQUE: Multidetector CT imaging of the head and cervical spine was performed following the standard protocol without intravenous contrast. Multiplanar CT image reconstructions of the cervical spine were also generated. COMPARISON:  11/28/2016 FINDINGS: CT HEAD FINDINGS Brain: There is no intracranial hemorrhage, mass or evidence of acute infarction. There is moderate generalized atrophy. There is moderate chronic microvascular ischemic change. There is no significant extra-axial fluid collection. No acute intracranial findings are evident. Vascular: No  hyperdense vessel or unexpected calcification. Skull: Normal. Negative for fracture or focal lesion. Sinuses/Orbits: No  acute finding. Other: None. CT CERVICAL SPINE FINDINGS Alignment: Normal. Skull base and vertebrae: No acute fracture. No primary bone lesion or focal pathologic process. Soft tissues and spinal canal: No prevertebral fluid or swelling. No visible canal hematoma. Disc levels: Moderate cervical degenerative disc disease at C6-7. Facet joints are arthritic, but intact. Mild degenerative spondylolisthesis at C5-6. Upper chest: Negative. Other: None IMPRESSION: 1. No acute intracranial findings. There is moderate generalized atrophy and chronic appearing white matter hypodensities which likely represent small vessel ischemic disease. 2. Negative for acute cervical spine fracture Electronically Signed   By: Ellery Plunk M.D.   On: 12/30/2016 04:33   Ct Pelvis Wo Contrast  Result Date: 12/29/2016 CLINICAL DATA:  Right hip pain after fall. EXAM: CT PELVIS WITHOUT CONTRAST TECHNIQUE: Multidetector CT imaging of the pelvis was performed following the standard protocol without intravenous contrast. COMPARISON:  CXR 12/27/2016 FINDINGS: Urinary Tract:  No abnormality visualized. Bowel:  Unremarkable visualized pelvic bowel loops. Vascular/Lymphatic: Aorto bi-iliac atherosclerosis without aneurysm. Atherosclerosis of the internal iliac arteries bilaterally and right common femoral artery. No lymphadenopathy. Reproductive:  Uterus appears surgically absent.  No adnexal mass. Other:  None. Musculoskeletal: Minimal buckled appearing fracture of the right sacral ala along its lateral aspect adjacent to and extending into the synovial portion of the right SI joint. Acute closed with right parasymphysis and symphysis fracture extending into the pubic symphysis. One 0 mild osteoarthritis of the AC joints. Lower lumbar facet arthropathy is noted Schmorl's node along the superior endplate of L5. IMPRESSION: 1.  Acute right parasymphyseal and symphysis fracture with minimal displacement and with extension into the symphysis pubis confirming findings of prior radiographs. 2. Additional tiny buckled right sacral alar fracture extending into the synovial portion of the right SI joint. 3. Lower lumbar degenerative facet arthropathy. Electronically Signed   By: Tollie Eth M.D.   On: 12/29/2016 23:20   Dg Hip Unilat With Pelvis 2-3 Views Right  Result Date: 12/27/2016 CLINICAL DATA:  Right hip pain for 1 day EXAM: DG HIP (WITH OR WITHOUT PELVIS) 2-3V RIGHT COMPARISON:  None. FINDINGS: Mild SI joint arthritis. No fracture or malalignment of the proximal right femur is visualized. Possible fracture deformity of the right superior pubic ramus of uncertain acuity. Button artifact over the right femur on the lateral view. The pubic symphysis appears grossly intact. IMPRESSION: 1. No acute fracture or dislocation of the right hip 2. Possible subtle fracture deformity of the right superior pubic ramus of uncertain acuity. Follow-up CT could be obtained as indicated. These results will be called to the ordering clinician or representative by the Radiologist Assistant, and communication documented in the PACS or zVision Dashboard. Electronically Signed   By: Jasmine Pang M.D.   On: 12/27/2016 22:13    ASSESSMENT/PLAN:  Unsteady gait - will have home health PT, OT, CNA, Nursing and Speech Therapy; fall precautions  Pelvic fracture - nonsurgical management was recommended by orthopedics, follow up with orthopedics, WBAT, continue tramadol 50 mg 1 tab by mouth every 6 hours when necessary for pain  Hypertension - well-controlled; continue atenolol 50 mg 1 tab by mouth daily  Constipation - continue GlycoLax powder 17 g by mouth daily, senna 8.6 one tab by mouth daily at bedtime when necessary  Hyperlipidemia - continue atorvastatin 40 mg 1 tab by mouth daily  Vitamin D deficiency - continue vitamin D 2000 units 1 tab by  mouth daily  Leukocytosis - trending down, no fever  Anemia - stable Lab Results  Component Value Date   HGB 11.4 (L) 01/01/2017       I have filled out patient's discharge paperwork and written prescriptions.  Patient will receive home health PT, OT, ST, Nursing and CNA.  DME provided:    Total discharge time: Greater than 30 minutes Greater than 50% was spent in counseling and coordination of care.    Discharge time involved coordination of the discharge process with social worker, nursing staff and therapy department. Medical justification for home health services verified.     Orla Estrin C. Medina-Vargas - NP    [employer] [phone]

## 2017-03-23 DEATH — deceased

## 2018-08-05 IMAGING — CT CT PELVIS W/O CM
2 of 3 series · 15 of 46 positions shown, 17 images · non-contrast
Comparison: CXR 12/27/2016

CLINICAL DATA: Right hip pain after fall.

EXAM:
CT PELVIS WITHOUT CONTRAST
TECHNIQUE: Multidetector CT imaging of the pelvis was performed following the
standard protocol without intravenous contrast.

[Series 4: pelvis st · axial · 0.67mm/px · z∈[+196,+386]mm · 12 of 44 slices shown, 14 images]
[im 3/44  soft-tissue]
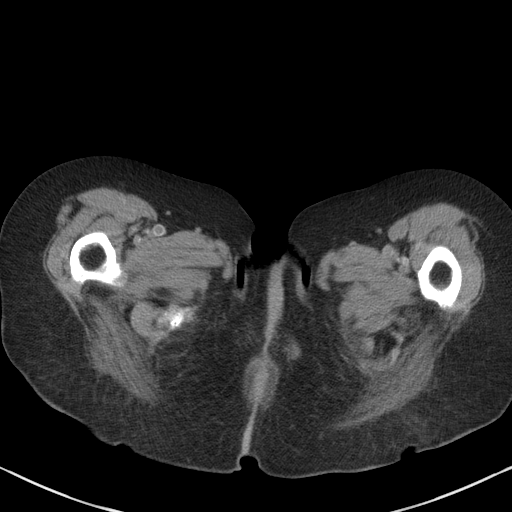
[im 3/44  bone]
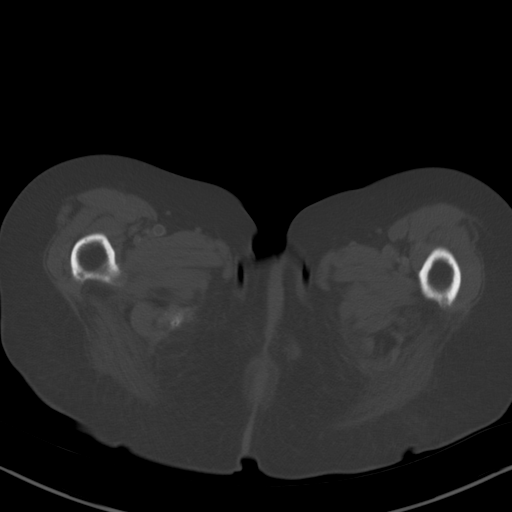
[im 6/44  soft-tissue]
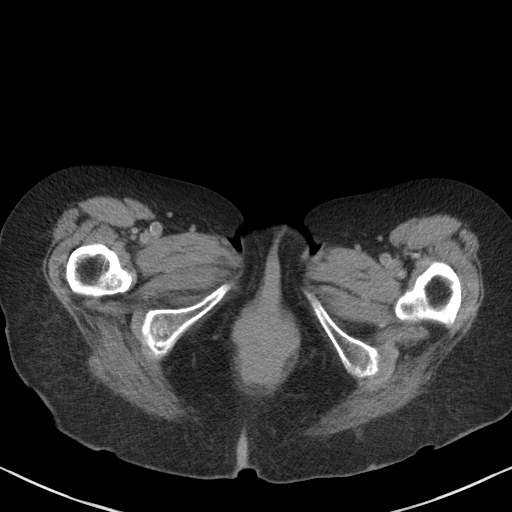
[im 10/44  soft-tissue]
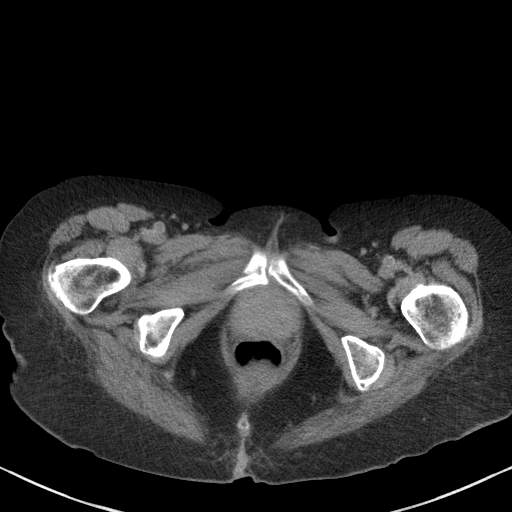
[im 13/44  soft-tissue]
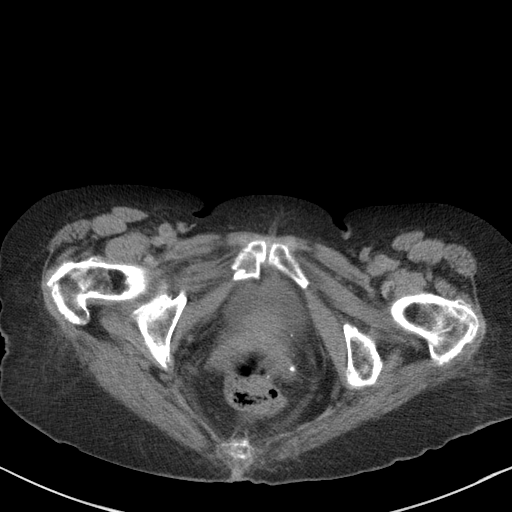
[im 17/44  soft-tissue]
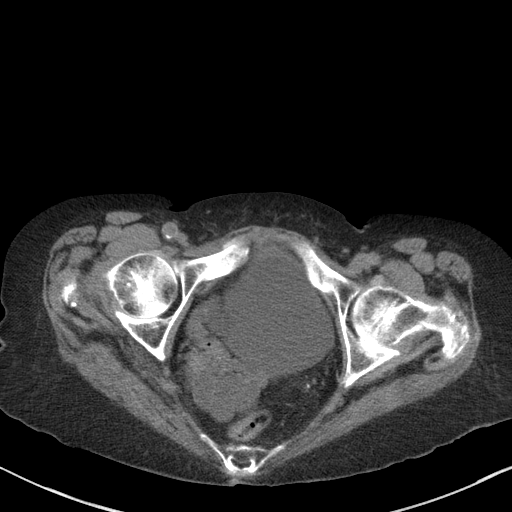
[im 20/44  soft-tissue]
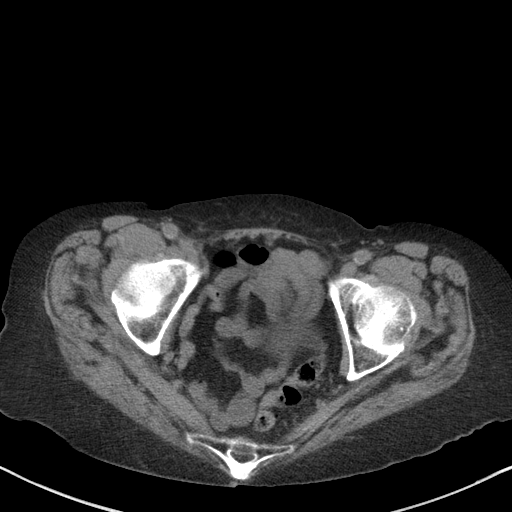
[im 24/44  soft-tissue]
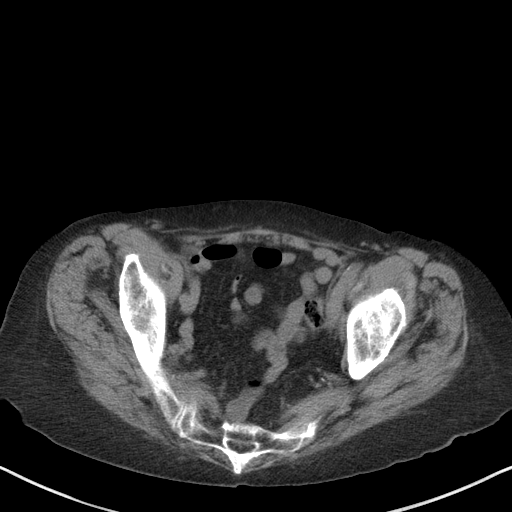
[im 27/44  soft-tissue]
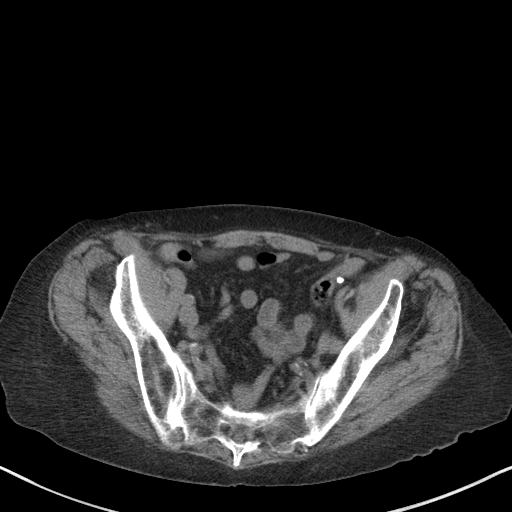
[im 31/44  soft-tissue]
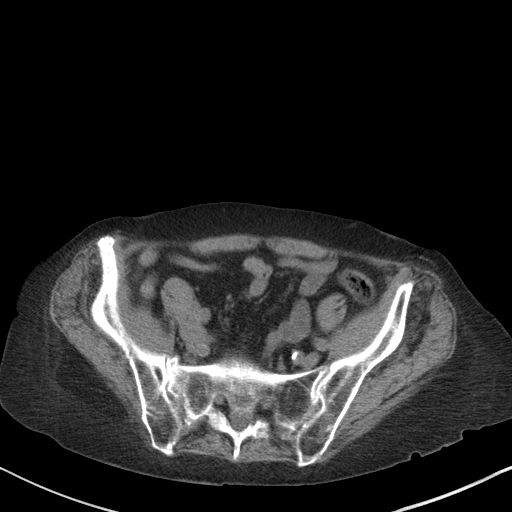
[im 31/44  bone]
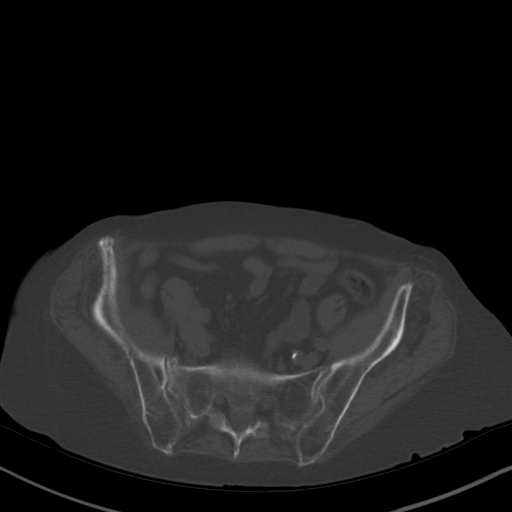
[im 34/44  soft-tissue]
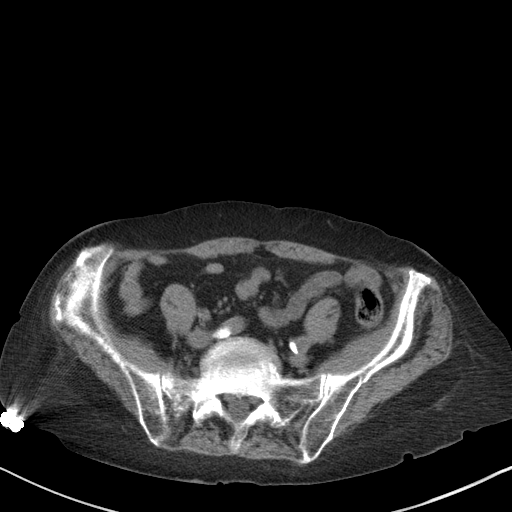
[im 38/44  soft-tissue]
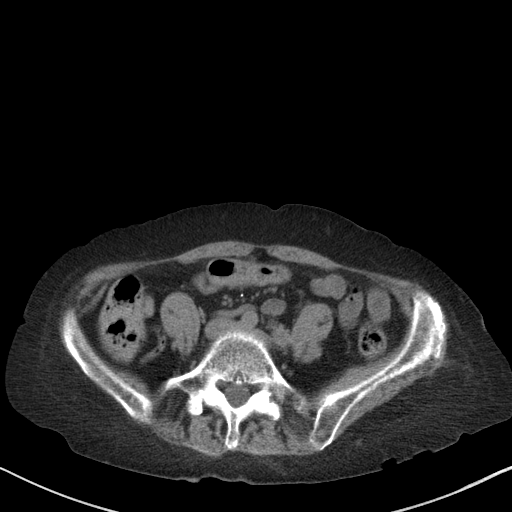
[im 41/44  soft-tissue]
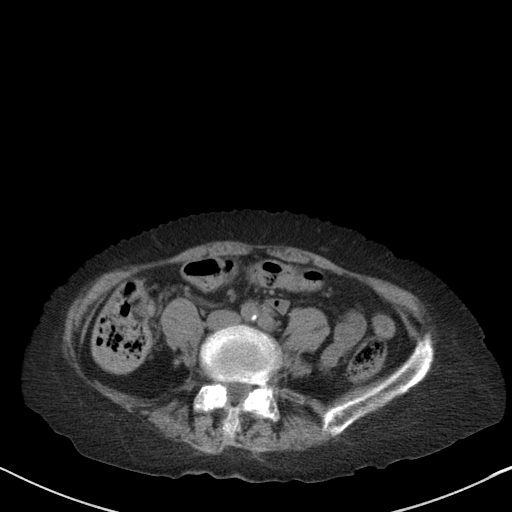

[Series 6: coronal images · coronal · 0.42mm/px · 3 of 107 slices shown]
[im 36/107  soft-tissue]
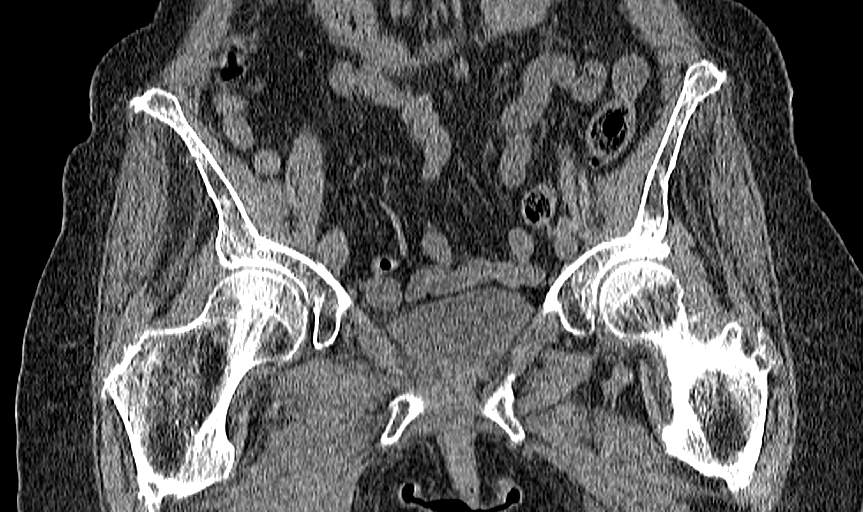
[im 48/107  soft-tissue]
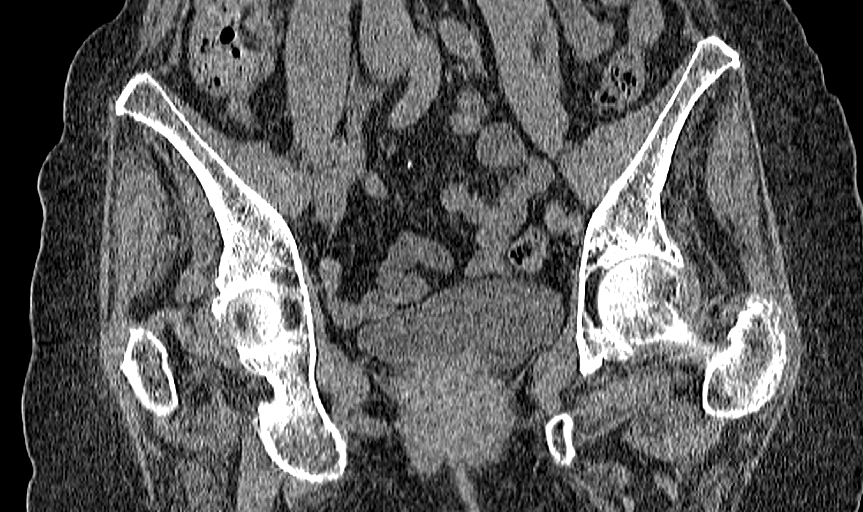
[im 59/107  soft-tissue]
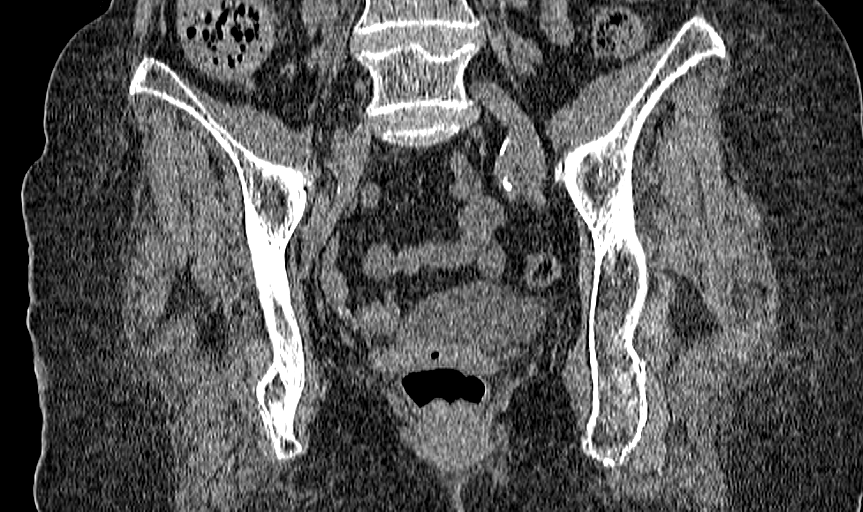

[15 of 46 positions shown; findings below may reference images not displayed]

FINDINGS: Urinary Tract:  No abnormality visualized.

Bowel:  Unremarkable visualized pelvic bowel loops.

Vascular/Lymphatic: Aorto bi-iliac atherosclerosis without aneurysm.
Atherosclerosis of the internal iliac arteries bilaterally and right
common femoral artery. No lymphadenopathy.

Reproductive:  Uterus appears surgically absent.  No adnexal mass.

Other:  None.

Musculoskeletal: Minimal buckled appearing fracture of the right
sacral ala along its lateral aspect adjacent to and extending into
the synovial portion of the right SI joint. Acute closed with right
parasymphysis and symphysis fracture extending into the pubic
symphysis. One 0 mild osteoarthritis of the AC joints. Lower lumbar
facet arthropathy is noted Schmorl's node along the superior
endplate of L5.
IMPRESSION: 1. Acute right parasymphyseal and symphysis fracture with minimal
displacement and with extension into the symphysis pubis confirming
findings of prior radiographs.
2. Additional tiny buckled right sacral alar fracture extending into
the synovial portion of the right SI joint.
3. Lower lumbar degenerative facet arthropathy.
